# Patient Record
Sex: Female | Born: 1980 | Race: Black or African American | Hispanic: No | Marital: Married | Smoking: Never smoker
Health system: Southern US, Community
[De-identification: ages and names within clinical notes are randomized; demographics above are authoritative.]

## PROBLEM LIST (undated history)

## (undated) ENCOUNTER — Inpatient Hospital Stay (HOSPITAL_COMMUNITY): Payer: Self-pay

## (undated) DIAGNOSIS — G43909 Migraine, unspecified, not intractable, without status migrainosus: Secondary | ICD-10-CM

## (undated) DIAGNOSIS — R87619 Unspecified abnormal cytological findings in specimens from cervix uteri: Secondary | ICD-10-CM

## (undated) DIAGNOSIS — IMO0002 Reserved for concepts with insufficient information to code with codable children: Secondary | ICD-10-CM

## (undated) DIAGNOSIS — U071 COVID-19: Secondary | ICD-10-CM

## (undated) DIAGNOSIS — J45909 Unspecified asthma, uncomplicated: Secondary | ICD-10-CM

## (undated) DIAGNOSIS — R569 Unspecified convulsions: Secondary | ICD-10-CM

## (undated) DIAGNOSIS — K219 Gastro-esophageal reflux disease without esophagitis: Secondary | ICD-10-CM

## (undated) HISTORY — DX: Unspecified abnormal cytological findings in specimens from cervix uteri: R87.619

## (undated) HISTORY — DX: Migraine, unspecified, not intractable, without status migrainosus: G43.909

## (undated) HISTORY — DX: Gastro-esophageal reflux disease without esophagitis: K21.9

## (undated) HISTORY — DX: Unspecified asthma, uncomplicated: J45.909

## (undated) HISTORY — DX: COVID-19: U07.1

## (undated) HISTORY — DX: Reserved for concepts with insufficient information to code with codable children: IMO0002

## (undated) HISTORY — DX: Unspecified convulsions: R56.9

---

## 2005-08-24 HISTORY — PX: WISDOM TOOTH EXTRACTION: SHX21

## 2010-08-24 DIAGNOSIS — G43909 Migraine, unspecified, not intractable, without status migrainosus: Secondary | ICD-10-CM | POA: Insufficient documentation

## 2010-08-24 HISTORY — DX: Migraine, unspecified, not intractable, without status migrainosus: G43.909

## 2011-01-26 ENCOUNTER — Other Ambulatory Visit: Payer: Self-pay | Admitting: Family Medicine

## 2011-01-29 ENCOUNTER — Ambulatory Visit
Admission: RE | Admit: 2011-01-29 | Discharge: 2011-01-29 | Disposition: A | Discharge status: Home / Self Care | Payer: Managed Care, Other (non HMO) | Source: Ambulatory Visit | Attending: Family Medicine | Admitting: Family Medicine

## 2012-04-21 ENCOUNTER — Other Ambulatory Visit: Payer: Private Health Insurance - Indemnity

## 2012-04-21 ENCOUNTER — Telehealth: Payer: Self-pay | Admitting: Obstetrics and Gynecology

## 2012-04-21 DIAGNOSIS — N912 Amenorrhea, unspecified: Secondary | ICD-10-CM

## 2012-04-21 NOTE — Progress Notes (Unsigned)
Pt here for UPT.  Pt requested quant.  Ordered quant per Ar.

## 2012-04-22 ENCOUNTER — Telehealth: Payer: Self-pay

## 2012-04-22 ENCOUNTER — Telehealth: Payer: Self-pay | Admitting: Obstetrics and Gynecology

## 2012-04-22 LAB — HCG, QUANTITATIVE, PREGNANCY: hCG, Beta Chain, Quant, S: 52.8 m[IU]/mL

## 2012-04-22 NOTE — Telephone Encounter (Signed)
Spoke to pt to let her know her preg. Test was positive. , [facility]

## 2012-04-22 NOTE — Telephone Encounter (Signed)
Triage/res. °

## 2012-04-27 ENCOUNTER — Telehealth: Payer: Self-pay

## 2012-04-27 NOTE — Telephone Encounter (Signed)
LM for pt to cb and schedule a NOB int. And work-up with one of the midwives for + preg test  On the 29th Aug. Per AR. JO She was told to sched this around the first couple weeks in Oct.

## 2012-04-29 ENCOUNTER — Other Ambulatory Visit (HOSPITAL_BASED_OUTPATIENT_CLINIC_OR_DEPARTMENT_OTHER): Payer: Self-pay | Admitting: Family Medicine

## 2012-04-29 ENCOUNTER — Ambulatory Visit (HOSPITAL_BASED_OUTPATIENT_CLINIC_OR_DEPARTMENT_OTHER)
Admission: RE | Admit: 2012-04-29 | Discharge: 2012-04-29 | Disposition: A | Discharge status: Home / Self Care | Payer: Managed Care, Other (non HMO) | Source: Ambulatory Visit | Attending: Family Medicine | Admitting: Family Medicine

## 2012-04-29 ENCOUNTER — Encounter (HOSPITAL_BASED_OUTPATIENT_CLINIC_OR_DEPARTMENT_OTHER): Payer: Self-pay

## 2012-04-29 DIAGNOSIS — R1011 Right upper quadrant pain: Secondary | ICD-10-CM | POA: Insufficient documentation

## 2012-04-29 DIAGNOSIS — N949 Unspecified condition associated with female genital organs and menstrual cycle: Secondary | ICD-10-CM | POA: Insufficient documentation

## 2012-05-12 ENCOUNTER — Encounter: Payer: Self-pay | Admitting: Obstetrics and Gynecology

## 2012-05-12 ENCOUNTER — Ambulatory Visit (INDEPENDENT_AMBULATORY_CARE_PROVIDER_SITE_OTHER): Payer: Managed Care, Other (non HMO) | Admitting: Obstetrics and Gynecology

## 2012-05-12 DIAGNOSIS — Z331 Pregnant state, incidental: Secondary | ICD-10-CM

## 2012-05-12 LAB — POCT URINALYSIS DIPSTICK
Bilirubin, UA: NEGATIVE
Blood, UA: 1
Nitrite, UA: NEGATIVE
Spec Grav, UA: 1.015
pH, UA: 7

## 2012-05-13 LAB — PRENATAL PANEL VII
Basophils Absolute: 0.1 10*3/uL (ref 0.0–0.1)
HCT: 36.1 % (ref 36.0–46.0)
HIV: NONREACTIVE
Hemoglobin: 12.3 g/dL (ref 12.0–15.0)
Lymphocytes Relative: 33 % (ref 12–46)
Lymphs Abs: 2.9 10*3/uL (ref 0.7–4.0)
MCV: 80.8 fL (ref 78.0–100.0)
Monocytes Absolute: 0.8 10*3/uL (ref 0.1–1.0)
Monocytes Relative: 9 % (ref 3–12)
Neutro Abs: 4.9 10*3/uL (ref 1.7–7.7)
RBC: 4.47 MIL/uL (ref 3.87–5.11)
RDW: 13.5 % (ref 11.5–15.5)
Rh Type: POSITIVE
Rubella: 48.4 IU/mL — ABNORMAL HIGH
WBC: 8.8 10*3/uL (ref 4.0–10.5)

## 2012-05-14 LAB — CULTURE, OB URINE: Colony Count: 2000

## 2012-05-16 LAB — HEMOGLOBINOPATHY EVALUATION: Hgb F Quant: 0 % (ref 0.0–2.0)

## 2012-05-20 ENCOUNTER — Telehealth: Payer: Self-pay | Admitting: Obstetrics and Gynecology

## 2012-05-20 ENCOUNTER — Emergency Department: Payer: Self-pay | Admitting: Emergency Medicine

## 2012-05-20 NOTE — Telephone Encounter (Signed)
TC from pt.  States on 05/17/12 had lower rt calf pain and swelling.  Pain continued on and off for a few days and is now better.Now having chest pain.  Had shortness of breath 1 or 2 nights ago.  Has hx heart palpitations which improved with reflux med. Per VL advised medical  eval at ER. Pt states [facility] is closest. Advised to be seen there rather than [facility]. or [facility]. Pt agreeable.

## 2012-05-23 ENCOUNTER — Telehealth: Payer: Self-pay | Admitting: Obstetrics and Gynecology

## 2012-05-23 DIAGNOSIS — O26849 Uterine size-date discrepancy, unspecified trimester: Secondary | ICD-10-CM

## 2012-05-23 NOTE — Telephone Encounter (Signed)
TC from pt. States has eval last week for chest pain. States since 05/17/12 has had brown spotting x 2 days.  REucurred 05/21/12 and this AM. No recent IC. Had some cramping after walking last week. No UTI sx. Will consult with provider and call back.

## 2012-05-23 NOTE — Telephone Encounter (Signed)
TC to pt. Per DD informed scheduled for U/S and F/U.  05/24/12. To call increased bleeding or pain.  Pt verbalizes comprehension.

## 2012-05-24 ENCOUNTER — Encounter: Payer: Self-pay | Admitting: Obstetrics and Gynecology

## 2012-05-24 ENCOUNTER — Ambulatory Visit (INDEPENDENT_AMBULATORY_CARE_PROVIDER_SITE_OTHER): Payer: Managed Care, Other (non HMO)

## 2012-05-24 ENCOUNTER — Ambulatory Visit (INDEPENDENT_AMBULATORY_CARE_PROVIDER_SITE_OTHER): Payer: Managed Care, Other (non HMO) | Admitting: Obstetrics and Gynecology

## 2012-05-24 VITALS — BP 110/82 | Wt 166.0 lb

## 2012-05-24 DIAGNOSIS — B373 Candidiasis of vulva and vagina: Secondary | ICD-10-CM | POA: Insufficient documentation

## 2012-05-24 DIAGNOSIS — Z331 Pregnant state, incidental: Secondary | ICD-10-CM

## 2012-05-24 DIAGNOSIS — O26849 Uterine size-date discrepancy, unspecified trimester: Secondary | ICD-10-CM

## 2012-05-24 DIAGNOSIS — B3731 Acute candidiasis of vulva and vagina: Secondary | ICD-10-CM

## 2012-05-24 LAB — US OB COMP LESS 14 WKS

## 2012-05-24 MED ORDER — TERCONAZOLE 0.4 % VA CREA: 1.0000 | TOPICAL_CREAM | Freq: Every day | VAGINAL | Status: DC

## 2012-05-24 NOTE — Progress Notes (Signed)
Patient ID: Sylvia Townsend, female   DOB: May 30, 1981, 31 y.o.   MRN: [id number] [place] see note vaible pg dates agree LMP for dating.  Kinga Michelotti  30 y.o. [redacted]w[redacted]d C/o of brown discharge abd soft nt EGBUS WNL SSE mod amount white discharge no blood Cervix LTC A VVC P terazol 7 RX discussed, early pg s/s bleeding pain to report reviewed. [name], CNM

## 2012-05-24 NOTE — Progress Notes (Signed)
Pt c/o daily spotting and brownish discharge. F/u u/s 1st trimester spotting.  + FHT  Anteverted uterus Amnion and yolk sac seen  Normal ovaries/adnexa

## 2012-06-06 ENCOUNTER — Ambulatory Visit (INDEPENDENT_AMBULATORY_CARE_PROVIDER_SITE_OTHER): Payer: Managed Care, Other (non HMO)

## 2012-06-06 ENCOUNTER — Encounter: Payer: Self-pay | Admitting: Obstetrics and Gynecology

## 2012-06-06 ENCOUNTER — Ambulatory Visit (INDEPENDENT_AMBULATORY_CARE_PROVIDER_SITE_OTHER): Payer: Managed Care, Other (non HMO) | Admitting: Obstetrics and Gynecology

## 2012-06-06 VITALS — BP 122/72 | Ht 63.0 in | Wt 167.0 lb

## 2012-06-06 DIAGNOSIS — J45990 Exercise induced bronchospasm: Secondary | ICD-10-CM

## 2012-06-06 DIAGNOSIS — O368390 Maternal care for abnormalities of the fetal heart rate or rhythm, unspecified trimester, not applicable or unspecified: Secondary | ICD-10-CM

## 2012-06-06 DIAGNOSIS — Z331 Pregnant state, incidental: Secondary | ICD-10-CM

## 2012-06-06 LAB — US OB LIMITED

## 2012-06-06 NOTE — Progress Notes (Signed)
[redacted]w[redacted]d    Sylvia Townsend is being seen today for her first obstetrical visit at [redacted]w[redacted]d gestation by LMP 03/24/13.  She reports slight nausea and "would like to have some Zofran available if possible".  Her obstetrical history is significant for: Patient Active Problem List  Diagnosis  . Yeast vaginitis  . Pregnant state, incidental    Relationship with FOB: involved. The patient is married to FOB  Feeding plan:   Breast feed.  Pregnancy history fully reviewed.   Review of Systems Pertinent ROS is described in HPI   Objective:   BP 122/72  Ht 5\' 3"  (1.6 m)  Wt 167 lb (75.751 kg)  BMI 29.58 kg/m2  LMP 03/24/2012 Wt Readings from Last 1 Encounters:  06/06/12 167 lb (75.751 kg)   BMI: Body mass index is 29.58 kg/(m^2).  General: alert, cooperative and no distress Respiratory: clear to auscultation bilaterally Cardiovascular: regular rate and rhythm, S1, S2 normal, no murmur Breasts:  No dominant masses, nipples erect, tender to examine. Gastrointestinal: soft, non-tender; no masses,  no organomegaly Extremities: extremities normal, no pain or edema Vaginal Bleeding: None  EXTERNAL GENITALIA: normal appearing vulva with no masses, tenderness or lesions VAGINA: no abnormal discharge or lesions CERVIX: no lesions or cervical motion tenderness; cervix closed, long, firm UTERUS: gravid and consistent with 10 weeks ADNEXA: no masses palpable and nontender OB EXAM PELVIMETRY: appears adequate   FHR:  170  bpm  Assessment:    Pregnancy at  [redacted]w[redacted]d  Plan:     Prenatal panel reviewed and discussed with the patient:yes Pap smear collected:yes. The patient has hx of normal Pp smears. GC/Chlamydia collected:yes Wet prep:  PH 5.0, negative. Discussion of Genetic testing options: Offered. The patient might consider Quad and AFP at time of Glucola at 28 weeks. Prenatal vitamins recommended - taking same. Problem list reviewed and updated.  Plan of care: Follow up in 4  weeks for ROB. Patient has been given a letter to help her get time off work for her Prenatal care visits. MK has taken HR papers form her at her first visit and was unable to find them at this visit. Possibly being scanned into the system.                         Earl Gala, CNM.

## 2012-06-06 NOTE — Progress Notes (Signed)
[redacted]w[redacted]d Pt has no concerns

## 2012-06-07 LAB — PAP IG, CT-NG, RFX HPV ASCU
Chlamydia Probe Amp: NEGATIVE
GC Probe Amp: NEGATIVE

## 2012-06-16 ENCOUNTER — Inpatient Hospital Stay (HOSPITAL_COMMUNITY): Payer: Managed Care, Other (non HMO)

## 2012-06-16 ENCOUNTER — Inpatient Hospital Stay (HOSPITAL_COMMUNITY)
Admission: AD | Admit: 2012-06-16 | Discharge: 2012-06-16 | Disposition: A | Discharge status: Home / Self Care | Payer: Managed Care, Other (non HMO) | Source: Ambulatory Visit | Attending: Obstetrics and Gynecology | Admitting: Obstetrics and Gynecology

## 2012-06-16 ENCOUNTER — Encounter (HOSPITAL_COMMUNITY): Payer: Self-pay | Admitting: *Deleted

## 2012-06-16 DIAGNOSIS — O200 Threatened abortion: Secondary | ICD-10-CM

## 2012-06-16 DIAGNOSIS — O26859 Spotting complicating pregnancy, unspecified trimester: Secondary | ICD-10-CM | POA: Insufficient documentation

## 2012-06-16 LAB — URINALYSIS, ROUTINE W REFLEX MICROSCOPIC
Bilirubin Urine: NEGATIVE
Glucose, UA: NEGATIVE mg/dL
Ketones, ur: 40 mg/dL — AB
pH: 7 (ref 5.0–8.0)

## 2012-06-16 LAB — URINE MICROSCOPIC-ADD ON

## 2012-06-16 LAB — WET PREP, GENITAL: Yeast Wet Prep HPF POC: NONE SEEN

## 2012-06-16 NOTE — MAU Provider Note (Signed)
History   Sylvia Townsend called this morning with Hx of Spotting in the last 2 days. This morning she had bright re blood spotting PVnot associated with cramps. Advised to come to MAU  for evaluation.  Had further  CSN: [id number]  Arrival date and time: 06/16/12 0825   None     Chief Complaint  Patient presents with  . Vaginal Bleeding   HPI  OB History    Grav Para Term Preterm Abortions TAB SAB Ect Mult Living   1               Past Medical History  Diagnosis Date  . Abnormal Pap smear 2004,2005    Repeat  pap after 6 mos. back as normal;Last pap 03/2011;was normal  . Asthma     Induced w/ weather changes;inhaler prn  . Migraines 2012    Was rx'd meds   . Acid reflux     Was rx'd meds  . Heart palpitations 2013    had f/u w/ PCP;thought was related to acid reflux;happens @ night or after eating  . Seizure     x 2 as a Toddler    Past Surgical History  Procedure Date  . Wisdom tooth extraction 2007    all 4 removed    Family History  Problem Relation Age of Onset  . Stroke Maternal Grandfather   . Cancer Paternal Uncle     Throat  . Cancer Paternal Grandmother     Unsure which type  . Hypertension Maternal Grandfather   . Migraines Father   . Asthma Sister     x 2  . Asthma Cousin     Maternal  . Asthma Other     Nephew  . Kidney failure Maternal Uncle   . Lupus Sister   . Diverticulitis Mother     History  Substance Use Topics  . Smoking status: Never Smoker   . Smokeless tobacco: Never Used  . Alcohol Use: 1.0 oz/week    2 drink(s) per week     socially     Allergies: No Known Allergies  Prescriptions prior to admission  Medication Sig Dispense Refill  . Prenatal Vit-Fe Sulfate-FA (PRENATAL VITAMIN PO) Take 1 tablet by mouth daily.      Marland Kitchen albuterol (PROVENTIL HFA;VENTOLIN HFA) 108 (90 BASE) MCG/ACT inhaler Inhale 2 puffs into the lungs every 6 (six) hours as needed. Rescue inhaler        Review of Systems  Constitutional: Negative.    HENT: Negative.   Eyes: Negative.   Respiratory: Negative.   Cardiovascular: Negative.   Gastrointestinal: Negative.   Genitourinary: Negative.   Musculoskeletal: Negative.   Skin: Negative.   Neurological: Negative.   Endo/Heme/Allergies: Negative.   Psychiatric/Behavioral: Negative.    Physical Exam   Blood pressure 118/76, pulse 86, temperature 98 F (36.7 C), temperature source Oral, resp. rate 18, height 5' 2.5" (1.588 m), weight 170 lb (77.111 kg), last menstrual period 03/24/2012.  Physical Exam  Constitutional: She is oriented to person, place, and time. She appears well-developed and well-nourished.  Eyes: Conjunctivae normal and EOM are normal. Pupils are equal, round, and reactive to light.  Neck: Normal range of motion. Neck supple.  Cardiovascular: Normal rate, regular rhythm and normal heart sounds.   Respiratory: Effort normal and breath sounds normal.  GI: Soft. Bowel sounds are normal.  Genitourinary: Vagina normal and uterus normal.       Spotting PV at 12 GA  Musculoskeletal: Normal range of  motion.  Neurological: She is alert and oriented to person, place, and time. She has normal reflexes.  Skin: Skin is warm and dry.  Psychiatric: She has a normal mood and affect.    MAU Course  Procedures Speculum examination: no blood in vault No hemorrhoids Wet Prep GC/ Chlamydia Limited OB USS to R/O [facility]  Assessment and Plan  Wet Prep: Neg GC/ Chlamydia : pending Speculum examination: neg USS: normal findings with no SCH noted  Naya Ilagan, CNM. 06/16/2012, 10:53 AM

## 2012-06-16 NOTE — MAU Note (Signed)
Bright red bleeding noted when first got up this morning.  On Tues noted pinkish streaks in d/c.  No pain last night or today.

## 2012-06-17 LAB — GC/CHLAMYDIA PROBE AMP, GENITAL
Chlamydia, DNA Probe: NEGATIVE
GC Probe Amp, Genital: NEGATIVE

## 2012-06-18 LAB — URINE CULTURE

## 2012-07-04 ENCOUNTER — Encounter: Payer: Self-pay | Admitting: Obstetrics and Gynecology

## 2012-07-04 ENCOUNTER — Ambulatory Visit (INDEPENDENT_AMBULATORY_CARE_PROVIDER_SITE_OTHER): Payer: Managed Care, Other (non HMO) | Admitting: Obstetrics and Gynecology

## 2012-07-04 VITALS — BP 110/70 | Wt 163.0 lb

## 2012-07-04 DIAGNOSIS — Z331 Pregnant state, incidental: Secondary | ICD-10-CM

## 2012-07-04 DIAGNOSIS — Z8719 Personal history of other diseases of the digestive system: Secondary | ICD-10-CM

## 2012-07-04 DIAGNOSIS — Z3689 Encounter for other specified antenatal screening: Secondary | ICD-10-CM

## 2012-07-04 NOTE — Addendum Note (Signed)
Addended by: Janeece Agee on: 07/04/2012 09:36 AM   Modules accepted: Orders

## 2012-07-04 NOTE — Patient Instructions (Signed)
Exercise During Pregnancy It is possible to maintain a healthy exercise program throughout a pregnancy. However, it is important to discuss exercising with your caregiver, so that the two of you may develop an appropriate exercise program. It is important to remember that exercise during pregnancy should be use to maintain one's health and not to lose weight. Strenuous activities should be avoided as the may cause the baby to have difficulty obtaining proper amounts of oxygen. A proper pregnancy exercise plan has many benefits including preparing you for the physical challenges of childbirth by strengthening the muscles that help with childbirth, reducing common backaches, alleviating constipation, improving posture, elevating mood, and promoting better sleep. If possible, begin exercising regularly before you become pregnant and continue through the duration of the pregnancy. It is difficult for a woman to begin an exercise program later in a pregnancy due to enlargement of the uterus and breasts and a shift in the center of gravity. Pregnancy exercise programs should be aimed at improving the muscles of the heart, back, pelvis, and abdomen. GENERAL GUIDELINES  Every woman and pregnancy is different; thus, the level of exercise you can do depends on your health, the conditions of the pregnancy, and activity level before pregnancy. For women who were sedentary before pregnancy, walking is a good way to begin. Use caution while participation in sports during pregnancy, because your center of gravity changes and may affect your balance or that require rapid movements. Always make sure to drink plenty of fluids to avoid dehydration, which may decrease blood flow to your baby. Avoid any activity that has the potential for trauma to the abdomen. If possible try to avoid high-altitude activities, which can deprive you and your baby of oxygen; this may cause premature labor. Talk with your caregiver.  Performing a  proper warm up and cool down are very important. It is important to start slowly and build up to more demanding exercises. Toward the end of an exercise session, gradually slow your activity. Perhaps try and work back through the exercises in reverse order. Check your pulse during peak activity, and discuss with your caregiver an appropriate range of heart rate for activity. Slow down your activity if your heart starts beating faster than the target range recommended by your health care provider. Do not exceed a heart rate of 140 beats per minute. Exercise that is too strenuous may speed up the baby's heartbeat to a dangerous level. In general, if you are able to carry on a conversation comfortably while exercising, your heart rate is probably within the recommended limits. Check to make sure.  You should stop exercising and call your health care provider if you have any unusual symptoms, such as pain, uterine contractions, chest pain, bleeding or fluid leakage from the vagina, dizziness, or shortness of breath. Talk to your health caregiver if you have any questions.  Document Released: 08/10/2005 Document Revised: 11/02/2011 Document Reviewed: 11/22/2008 [facility] Patient Information 2013 [place], [place].  Round Ligament Pain The round ligament is made up of muscle and fibrous tissue. It is attached to the uterus near the fallopian tube. The round ligament is located on both sides of the uterus and helps support the position of the uterus. It usually begins in the second trimester of pregnancy when the uterus comes out of the pelvis. The pain can come and go until the baby is delivered. Round ligament pain is not a serious problem and does not cause harm to the baby. CAUSE During pregnancy the uterus  grows the most from the second trimester to delivery. As it grows, it stretches and slightly twists the round ligaments. When the uterus leans from one side to the other, the round ligament on the opposite side  pulls and stretches. This can cause pain. SYMPTOMS  Pain can occur on one side or both sides. The pain is usually a short, sharp, and pinching-like. Sometimes it can be a dull, lingering and aching pain. The pain is located in the lower side of the abdomen or in the groin. The pain is internal and usually starts deep in the groin and moves up to the outside of the hip area. Pain can occur with:  Sudden change in position like getting out of bed or a chair.  Rolling over in bed.  Coughing or sneezing.  Walking too much.  Any type of physical activity. DIAGNOSIS  Your caregiver will make sure there are no serious problems causing the pain. When nothing serious is found, the symptoms usually indicate that the pain is from the round ligament. TREATMENT   Sit down and relax when the pain starts.  Flex your knees up to your belly.  Lay on your side with a pillow under your belly (abdomen) and another one between your legs.  Sit in a hot bath for 15 to 20 minutes or until the pain goes away. HOME CARE INSTRUCTIONS   Only take over-the-counter or prescriptions medicines for pain, discomfort or fever as directed by your caregiver.  Sit and stand slowly.  Avoid long walks if it causes pain.  Stop or lessen your physical activities if it causes pain. SEEK MEDICAL CARE IF:   The pain does not go away with any of your treatment.  You need stronger medication for the pain.  You develop back pain that you did not have before with the side pain. SEEK IMMEDIATE MEDICAL CARE IF:   You develop a temperature of 102 F (38.9 C) or higher.  You develop uterine contractions.  You develop vaginal bleeding.  You develop nausea, vomiting or diarrhea.  You develop chills.  You have pain when you urinate. Document Released: 05/19/2008 Document Revised: 11/02/2011 Document Reviewed: 05/19/2008 [facility] Patient Information 2013 [place], [place].

## 2012-07-04 NOTE — Progress Notes (Signed)
[redacted]w[redacted]d Pt agrees to FLU shot  No VB since seen at [facility] 10/24 Some RLP Walks for exercise 6+ miles!  rv'd keeping HR <140, if uncomfortable stop rv'd stretching and comfort measures Desires quad screen Hx migraines - none until this weekend, resolved w sleep, rvd comfort measures ok to take motrin until 28wks rv'd pt hx of "heart palpitations" sounds more like reflux, has been ok, rec zantac PRN rv'd hx asthma - uses albuterol PRN, most during colder weather, hasn't needed it recently  RTO 4wks for quad and anatomy scan

## 2012-07-04 NOTE — Progress Notes (Signed)
[redacted]w[redacted]d Recent hospital visit due to spotting; spotting has subsided  Pt wants genetic screenings Pt wants to wait until she is further along before getting flu vaccine.

## 2012-08-01 ENCOUNTER — Encounter: Payer: Self-pay | Admitting: Obstetrics and Gynecology

## 2012-08-01 ENCOUNTER — Ambulatory Visit (INDEPENDENT_AMBULATORY_CARE_PROVIDER_SITE_OTHER): Payer: Managed Care, Other (non HMO)

## 2012-08-01 ENCOUNTER — Ambulatory Visit (INDEPENDENT_AMBULATORY_CARE_PROVIDER_SITE_OTHER): Payer: Managed Care, Other (non HMO) | Admitting: Obstetrics and Gynecology

## 2012-08-01 VITALS — BP 120/80 | Wt 175.0 lb

## 2012-08-01 DIAGNOSIS — Z1379 Encounter for other screening for genetic and chromosomal anomalies: Secondary | ICD-10-CM

## 2012-08-01 DIAGNOSIS — O26899 Other specified pregnancy related conditions, unspecified trimester: Secondary | ICD-10-CM

## 2012-08-01 DIAGNOSIS — R300 Dysuria: Secondary | ICD-10-CM

## 2012-08-01 DIAGNOSIS — Z3689 Encounter for other specified antenatal screening: Secondary | ICD-10-CM

## 2012-08-01 LAB — US OB COMP + 14 WK

## 2012-08-01 LAB — POCT URINALYSIS DIPSTICK
Blood, UA: NEGATIVE
Protein, UA: NEGATIVE
Spec Grav, UA: 1.01
Urobilinogen, UA: NEGATIVE

## 2012-08-01 NOTE — Progress Notes (Signed)
Patient ID: Sylvia Townsend, female   DOB: 1980/10/14, 31 y.o.   MRN: [id number] w4d [redacted]w[redacted]d Plans quad today US wnl [name], CNM

## 2012-08-01 NOTE — Progress Notes (Signed)
[redacted]w[redacted]d C/o having one episode of heartburn plan otc zantac 150 Discussed common discomforts of pg, excessive weight gain, nutrition. Avoid sauces, gravy, sweet tea, soda, fried foods, limit eating out and watch food choices and portion sizes. No more than 1/2 cup juice daily, better to eat fruit then drink juice. Increase fiber in diet, fresh rather than processed foods. Get protein throught meals and snacks to include meat, eggs, beans, nuts skim an fat free dairy: milk, cheese, yougart, 8 glasses of water daily. Exercise discussed. [name], CNM  Ultrasound shows:  SIUP  S=D     [place] EDD: 12/29/12           AFI: NORMAL (VERTICAL POCKET = 4.8 CM)           Cervical length: 3.76 cm           Placenta localization: posterior           Fetal presentation: Vertex                   Anatomy survey is normal           Gender : female

## 2012-08-02 LAB — AFP, QUAD SCREEN
AFP: 64.4 IU/mL
Age Alone: 1:791 {titer}
Curr Gest Age: 18.4 wks.days
Down Syndrome Scr Risk Est: <1:38500 {titer}
MoM for INH: 0.97
Trisomy 18 (Edward) Syndrome Interp.: 1:9080 {titer}

## 2012-08-24 NOTE — L&D Delivery Note (Signed)
Delivery Note  Pt began pushing about 0430 w good effort, FHR remained reassuring w occ mild variables  At 5:21 AM a viable female was delivered via Vaginal, Spontaneous Delivery (Presentation: ; Occiput Anterior).  Delivered through loose nuchal cord, shoulders delivered easily, APGAR: 9, 9; weight: pending  Placenta status: Intact, Spontaneous.  Cord: 3 vessels with the following complications: None.  Cord pH: n/a  Anesthesia: Epidural, local  Episiotomy: None Lacerations: 3rd degree;Vaginal Suture Repair: 3.0 vicryl Est. Blood Loss (mL): 300  Mom to postpartum.  Baby to nursery-stable. Infant remains skin-skin Pt plans to BF Pt desires inpatient circumcision  Mom and baby stable in recovery room Routine PP orders   Haruka Kowaleski M 12/25/2012, 6:29 AM

## 2012-08-29 ENCOUNTER — Ambulatory Visit (INDEPENDENT_AMBULATORY_CARE_PROVIDER_SITE_OTHER): Payer: Managed Care, Other (non HMO) | Admitting: Obstetrics and Gynecology

## 2012-08-29 VITALS — BP 130/62 | Wt 180.0 lb

## 2012-08-29 DIAGNOSIS — Z331 Pregnant state, incidental: Secondary | ICD-10-CM

## 2012-08-29 NOTE — Progress Notes (Signed)
[redacted]w[redacted]d No complaints Glucola at [place] RTO 4wks

## 2012-09-14 ENCOUNTER — Telehealth: Payer: Self-pay | Admitting: Obstetrics and Gynecology

## 2012-09-14 ENCOUNTER — Telehealth: Payer: Self-pay

## 2012-09-14 NOTE — Telephone Encounter (Signed)
I checked back with this pt to see how she's doing. She states she is feeling a little better. She did vomit up a little bit of water, but has kept down some crackers. She is resting. I told her to continue to observe and to follow previous instructions and keep me posted as to her sx's throughout  this evening and into tomorrow. She should call me directly if her sx's worsen. Pt is agreeable. Sylvia Townsend A

## 2012-09-14 NOTE — Telephone Encounter (Signed)
Spoke to pt who is 24 weeks preg and has been vomiting q 2 hrs since midnight last night. She has low grade temp of 99.1. She has some low back pain and body aches. She denies any dizziness or light-headedness. She reports good FM. She is calling for suggestions. She states she has been able to keep some Ginger Ale down with-in the last 30 mins. I recommended a few things to try....mainly BRAT diet, diluted juice as tolerated, Tylenol, rest and I asked her to check to see if she has any anti-nausea meds from earlier on in preg. She will look and if she does she will try that to see if she's able to keep that down. I will check back with her in a couple of hrs to re-eval her sx's. And we will go from there. Pt is agreeable and will try these suggestions. Melody Comas A

## 2012-09-16 ENCOUNTER — Ambulatory Visit: Payer: Managed Care, Other (non HMO) | Admitting: Obstetrics and Gynecology

## 2012-09-16 ENCOUNTER — Telehealth: Payer: Self-pay

## 2012-09-16 VITALS — BP 100/58 | Wt 187.0 lb

## 2012-09-16 DIAGNOSIS — Z331 Pregnant state, incidental: Secondary | ICD-10-CM

## 2012-09-16 DIAGNOSIS — N898 Other specified noninflammatory disorders of vagina: Secondary | ICD-10-CM

## 2012-09-16 LAB — POCT WET PREP (WET MOUNT)

## 2012-09-16 NOTE — Progress Notes (Signed)
C/o brownish discharge. Pt stated no other issues .

## 2012-09-16 NOTE — Progress Notes (Signed)
Patient ID: Sylvia Townsend, female   DOB: 10/13/1980, 32 y.o.   MRN: [id number] [redacted]w[redacted]d Vomiting with stomach flu 3 days ago and had red to brown discharge resolved, no irritation,   [name]   SSE mod amount thick yellow discharge, EGBUS WNL, cervix LTC Wet prep neg clue, neg trich, neg hypae Reviewed s/sto report preterm labor if 6 contractions in 1 hour after po fluids,  rest and frequent voids, srom, vag bleeding, daily kick counts to report,  encouraged 8 water daily and frequent voids. [name], CNM

## 2012-09-16 NOTE — Telephone Encounter (Signed)
Pt needs appt w/ MK per VL  Pt scheduled appt for 2:30pm  Pt agreeable.  [facility] CMA

## 2012-09-16 NOTE — Telephone Encounter (Signed)
Pt just getting over a GI virus  Feeling better returned to work today Pt called c/o brown spotting this morning around 10am  W/ wiping  Pt has not had intercourse since last weekend + cramping pain nothing severe, pt believes it to be ligament pain + FM Pt stated brown spotting heavy this morning but not enough to soak a panty liner. Will consult w/ Midwife or on call provider and call pt back  Pt agreeable   [facility] CMA

## 2012-09-26 ENCOUNTER — Ambulatory Visit: Payer: Managed Care, Other (non HMO) | Admitting: Obstetrics and Gynecology

## 2012-09-26 VITALS — BP 104/72 | Wt 187.0 lb

## 2012-09-26 DIAGNOSIS — Z331 Pregnant state, incidental: Secondary | ICD-10-CM

## 2012-09-26 LAB — CBC
HCT: 35.7 % — ABNORMAL LOW (ref 36.0–46.0)
MCV: 80.4 fL (ref 78.0–100.0)
Platelets: 247 10*3/uL (ref 150–400)
RBC: 4.44 MIL/uL (ref 3.87–5.11)
WBC: 14 10*3/uL — ABNORMAL HIGH (ref 4.0–10.5)

## 2012-09-26 NOTE — Progress Notes (Signed)
Doing well. Weight gain stable after increase last visit. Glucola, Hgb, RPR today. Blood type B+.

## 2012-09-26 NOTE — Progress Notes (Signed)
Pt stated no issues today. GGT given @  2:37     Draw@   3:37

## 2012-09-27 ENCOUNTER — Telehealth: Payer: Self-pay | Admitting: Obstetrics and Gynecology

## 2012-09-27 LAB — GLUCOSE TOLERANCE, 1 HOUR (50G) W/O FASTING: Glucose, 1 Hour GTT: 103 mg/dL (ref 70–140)

## 2012-09-27 NOTE — Telephone Encounter (Signed)
Tc to pt regarding msg.  Lm on vm to call back. 

## 2012-09-27 NOTE — Telephone Encounter (Signed)
Pt returned call, informed 1 hour glucola results were normal.  Pt very relieved and happy to her the news.

## 2012-09-27 NOTE — Telephone Encounter (Signed)
Message copied by Delon Sacramento on Tue Sep 27, 2012 12:58 PM ------      Message from: Cornelius Moras      Created: Tue Sep 27, 2012  7:28 AM       Please call patient and advise of normal 1 hour--she was anxious about the result.      Thanks!      VL

## 2012-10-19 ENCOUNTER — Ambulatory Visit: Payer: Managed Care, Other (non HMO) | Admitting: Certified Nurse Midwife

## 2012-10-19 VITALS — BP 110/62 | Wt 198.0 lb

## 2012-10-19 NOTE — Patient Instructions (Signed)
Preventing Preterm Labor Preterm labor is when a pregnant woman has contractions that cause the cervix to open, shorten, and thin before 37 weeks of pregnancy. You will have regular contractions (tightening) 2 to 3 minutes apart. This usually causes discomfort or pain. HOME CARE  Eat a healthy diet.  Take your vitamins as told by your doctor.  Drink enough fluids to keep your pee (urine) clear or pale yellow every day.  Get rest and sleep.  Do not have sex if you are at high risk for preterm labor.  Follow your doctor's advice about activity, medicines, and tests.  Avoid stress.  Avoid hard labor or exercise that lasts for a long time.  Do not smoke. GET HELP RIGHT AWAY IF:   You are having contractions.  You have belly (abdominal) pain.  You have bleeding from your vagina.  You have pain when you pee (urinate).  You have abnormal discharge from your vagina.  You have a temperature by mouth above 102 F (38.9 C). MAKE SURE YOU:  Understand these instructions.  Will watch your condition.  Will get help if you are not doing well or get worse. Document Released: 11/06/2008 Document Revised: 11/02/2011 Document Reviewed: 11/06/2008 [facility] Patient Information 2013 [place], [place].  Natural Childbirth Natural childbirth is going through labor and delivery without any drugs to relieve pain. You also do not use fetal monitors, have a cesarean delivery, or get a sugical cut to enlarge the vaginal opening (episiotomy). With the help of a birthing professional (midwife), you will direct your own labor and delivery as you choose. Many women chose natural childbirth because they feel more in control and in touch with their labor and delivery. They are also concerned about the medications affecting themselves and the baby. Pregnant women with a high risk pregnancy should not attempt natural childbirth. It is better to deliver the infant in a hospital if an emergency situation  arises. Sometimes, the caregiver has to intervene for the health and safety of the mother and infant. TWO TECHNIQUES FOR NATURAL CHILDBIRTH:   The Lamaze method. This method teaches women that having a baby is normal, healthy, and natural. It also teaches the mother to take a neutral position regarding pain medication and anesthesia and to make an informed decision if and when it is right for them.  The Erven Colla (also called husband coached birth). This method teaches the father to be the birth coach and stresses a natural approach. It also encourages exercise and a balanced diet with good nutrition. The exercises teach relaxation and deep breathing techniques. However, there are also classes to prepare the parents for an emergency situation that may occur. METHODS OF DEALING WITH LABOR PAIN AND DELIVERY:  Meditation.  Yoga.  Hypnosis.  Acupuncture.  Massage.  Changing positions (walking, rocking, showering, leaning on birth balls).  Lying in warm water or a jacuzzi.  Find an activity that keeps your mind off of the labor pain.  Listen to soft music.  Visual imagery (focus on a particular object). BEFORE GOING INTO LABOR  Be sure you and your spouse/partner are in agreement to have natural childbirth.  Decide if your caregiver or a midwife will deliver your baby.  Decide if you will have your baby in the hospital, birthing center, or at home.  If you have children, make plans to have someone to take care of them when you go to the hospital.  Know the distance and the time it takes to go to the  delivery center. Make a dry run to be sure.  Have a bag packed with a night gown, bathrobe, and toiletries ready to take when you go into labor.  Keep phone numbers of your family and friends handy if you need to call someone when you go into labor.  Your spouse or partner should go to all the teaching classes.  Talk with your caregiver about the possibility of a medical  emergency and what will happen if that occurs. ADVANTAGES OF NATURAL CHILDBIRTH  You are in control of your labor and delivery.  It is safe.  There are no medications or anesthetics that may affect you and the fetus.  There are no invasive procedures such as an episiotomy.  You and your partner will work together, which can increase your bond.  Meditation, yoga, massage, and breathing exercises can be learned while pregnant and help you when you are in labor and at delivery.  In most delivery centers, the family and friends can be involved in the labor and delivery process. DISADVANTAGES OF NATURAL CHILDBIRTH  You will experience pain during your labor and delivery.  The methods of helping relieve your labor pains may not work for you.  You may feel embarrassed, disappointed, and like a failure if you decide to change your mind during labor and not have natural childbirth. AFTER THE DELIVERY  You will be very tired.  You will be uncomfortable because of your uterus contracting. You will feel soreness around the vagina.  You may feel cold and shaky.This is a natural reaction.  You will be excited, overwhelmed, accomplished, and proud to be a mother. HOME CARE INSTRUCTIONS   Follow the advice and instructions of your caregiver.  Follow the instructions of your natural childbirth instructor (Lamaze or Bradley Method). Document Released: 07/23/2008 Document Revised: 11/02/2011 Document Reviewed: 07/23/2008 [facility] Patient Information 2013 [place], [place].  Normal Labor and Delivery Your caregiver must first be sure you are in labor. Signs of labor include:  You may pass what is called "the mucus plug" before labor begins. This is a small amount of blood stained mucus.  Regular uterine contractions.  The time between contractions get closer together.  The discomfort and pain gradually gets more intense.  Pains are mostly located in the back.  Pains get worse when  walking.  The cervix (the opening of the uterus becomes thinner (begins to efface) and opens up (dilates). Once you are in labor and admitted into the hospital or care center, your caregiver will do the following:  A complete physical examination.  Check your vital signs (blood pressure, pulse, temperature and the fetal heart rate).  Do a vaginal examination (using a sterile glove and lubricant) to determine:  The position (presentation) of the baby (head [vertex] or buttock first).  The level (station) of the baby's head in the birth canal.  The effacement and dilatation of the cervix.  You may have your pubic hair shaved and be given an enema depending on your caregiver and the circumstance.  An electronic monitor is usually placed on your abdomen. The monitor follows the length and intensity of the contractions, as well as the baby's heart rate.  Usually, your caregiver will insert an IV in your arm with a bottle of sugar water. This is done as a precaution so that medications can be given to you quickly during labor or delivery. NORMAL LABOR AND DELIVERY IS DIVIDED UP INTO 3 STAGES: First Stage This is when regular contractions begin and  the cervix begins to efface and dilate. This stage can last from 3 to 15 hours. The end of the first stage is when the cervix is 100% effaced and 10 centimeters dilated. Pain medications may be given by   Injection (morphine, demerol, etc.)  Regional anesthesia (spinal, caudal or epidural, anesthetics given in different locations of the spine). Paracervical pain medication may be given, which is an injection of and anesthetic on each side of the cervix. A pregnant woman may request to have "Natural Childbirth" which is not to have any medications or anesthesia during her labor and delivery. Second Stage This is when the baby comes down through the birth canal (vagina) and is born. This can take 1 to 4 hours. As the baby's head comes down through  the birth canal, you may feel like you are going to have a bowel movement. You will get the urge to bear down and push until the baby is delivered. As the baby's head is being delivered, the caregiver will decide if an episiotomy (a cut in the perineum and vagina area) is needed to prevent tearing of the tissue in this area. The episiotomy is sewn up after the delivery of the baby and placenta. Sometimes a mask with nitrous oxide is given for the mother to breath during the delivery of the baby to help if there is too much pain. The end of Stage 2 is when the baby is fully delivered. Then when the umbilical cord stops pulsating it is clamped and cut. Third Stage The third stage begins after the baby is completely delivered and ends after the placenta (afterbirth) is delivered. This usually takes 5 to 30 minutes. After the placenta is delivered, a medication is given either by intravenous or injection to help contract the uterus and prevent bleeding. The third stage is not painful and pain medication is usually not necessary. If an episiotomy was done, it is repaired at this time. After the delivery, the mother is watched and monitored closely for 1 to 2 hours to make sure there is no postpartum bleeding (hemorrhage). If there is a lot of bleeding, medication is given to contract the uterus and stop the bleeding. Document Released: 05/19/2008 Document Revised: 11/02/2011 Document Reviewed: 05/19/2008 [facility] Patient Information 2013 [place], [place].

## 2012-10-19 NOTE — Progress Notes (Signed)
[redacted]w[redacted]d 1 hr gtt = 103 Pt states she is having low abd pain and upper abd pressure when lying down.

## 2012-10-19 NOTE — Progress Notes (Signed)
[redacted]w[redacted]d Pt feeling well, c/o RL discomfort and lower back pain. Reassurance given Discussed when to call with labor sx. Pt instructions given: PTL prevention and Labor s/s

## 2012-11-02 ENCOUNTER — Ambulatory Visit: Payer: Managed Care, Other (non HMO) | Admitting: Obstetrics and Gynecology

## 2012-11-02 ENCOUNTER — Encounter: Payer: Self-pay | Admitting: Obstetrics and Gynecology

## 2012-11-02 VITALS — BP 102/62 | Wt 201.0 lb

## 2012-11-02 NOTE — Progress Notes (Signed)
[redacted]w[redacted]d Pt has noticed in the last 2 weeks being short of breath. She thinks it could be from the weight gain.

## 2012-11-02 NOTE — Progress Notes (Signed)
[redacted]w[redacted]d  GFM Glucola normal Lungs clear: reassurance Weight gain and diet reviewed

## 2012-12-20 ENCOUNTER — Encounter (HOSPITAL_COMMUNITY): Payer: Self-pay | Admitting: *Deleted

## 2012-12-20 ENCOUNTER — Inpatient Hospital Stay (HOSPITAL_COMMUNITY)
Admission: AD | Admit: 2012-12-20 | Discharge: 2012-12-20 | Disposition: A | Discharge status: Home / Self Care | Payer: Managed Care, Other (non HMO) | Source: Ambulatory Visit | Attending: Obstetrics and Gynecology | Admitting: Obstetrics and Gynecology

## 2012-12-20 DIAGNOSIS — R030 Elevated blood-pressure reading, without diagnosis of hypertension: Secondary | ICD-10-CM | POA: Insufficient documentation

## 2012-12-20 DIAGNOSIS — O479 False labor, unspecified: Secondary | ICD-10-CM | POA: Insufficient documentation

## 2012-12-20 DIAGNOSIS — O471 False labor at or after 37 completed weeks of gestation: Secondary | ICD-10-CM

## 2012-12-20 DIAGNOSIS — O99891 Other specified diseases and conditions complicating pregnancy: Secondary | ICD-10-CM | POA: Insufficient documentation

## 2012-12-20 DIAGNOSIS — R109 Unspecified abdominal pain: Secondary | ICD-10-CM | POA: Insufficient documentation

## 2012-12-20 DIAGNOSIS — Z8719 Personal history of other diseases of the digestive system: Secondary | ICD-10-CM

## 2012-12-20 DIAGNOSIS — O368190 Decreased fetal movements, unspecified trimester, not applicable or unspecified: Secondary | ICD-10-CM | POA: Insufficient documentation

## 2012-12-20 NOTE — MAU Note (Signed)
Patient states she had a lot of irregular contractions yesterday bur has had only 4 today with a lot of cramping in between. Denies bleeding or leaking. Patient reports the baby is moving but not as actively as before.

## 2012-12-20 NOTE — Progress Notes (Signed)
Written and verbal d/c instructions given and understanding voiced. 

## 2012-12-20 NOTE — Progress Notes (Signed)
Haroldine Laws CNM notified of pt's status. CNM in BS with labor pt. RN to reck cervix.

## 2012-12-20 NOTE — Progress Notes (Signed)
Haroldine Laws CNM notified of pt's admission and status. Aware of elevated B/Ps. Will see pt

## 2012-12-20 NOTE — Progress Notes (Signed)
PT up to walk

## 2012-12-24 ENCOUNTER — Encounter (HOSPITAL_COMMUNITY): Payer: Self-pay | Admitting: *Deleted

## 2012-12-24 ENCOUNTER — Inpatient Hospital Stay (HOSPITAL_COMMUNITY): Payer: Managed Care, Other (non HMO) | Admitting: Anesthesiology

## 2012-12-24 ENCOUNTER — Inpatient Hospital Stay (HOSPITAL_COMMUNITY)
Admission: AD | Admit: 2012-12-24 | Discharge: 2012-12-26 | DRG: 775 | Disposition: A | Discharge status: Home / Self Care | Payer: Managed Care, Other (non HMO) | Source: Ambulatory Visit | Attending: Obstetrics and Gynecology | Admitting: Obstetrics and Gynecology

## 2012-12-24 ENCOUNTER — Encounter (HOSPITAL_COMMUNITY): Payer: Self-pay | Admitting: Anesthesiology

## 2012-12-24 DIAGNOSIS — J45990 Exercise induced bronchospasm: Secondary | ICD-10-CM | POA: Diagnosis present

## 2012-12-24 DIAGNOSIS — IMO0001 Reserved for inherently not codable concepts without codable children: Secondary | ICD-10-CM

## 2012-12-24 DIAGNOSIS — Z8719 Personal history of other diseases of the digestive system: Secondary | ICD-10-CM

## 2012-12-24 LAB — CBC
HCT: 39.8 % (ref 36.0–46.0)
Hemoglobin: 13.6 g/dL (ref 12.0–15.0)
MCV: 81.9 fL (ref 78.0–100.0)
WBC: 10.9 10*3/uL — ABNORMAL HIGH (ref 4.0–10.5)

## 2012-12-24 LAB — TYPE AND SCREEN: Antibody Screen: NEGATIVE

## 2012-12-24 MED ORDER — SODIUM BICARBONATE 8.4 % IV SOLN
INTRAVENOUS | Status: DC | PRN
  Administered 2012-12-24: 5 mL via EPIDURAL

## 2012-12-24 MED ORDER — OXYTOCIN 40 UNITS IN LACTATED RINGERS INFUSION - SIMPLE MED
62.5000 mL/h | INTRAVENOUS | Status: DC
  Administered 2012-12-25: 62.5 mL/h via INTRAVENOUS

## 2012-12-24 MED ORDER — LACTATED RINGERS IV SOLN
500.0000 mL | Freq: Once | INTRAVENOUS | Status: AC
  Administered 2012-12-24: 500 mL via INTRAVENOUS

## 2012-12-24 MED ORDER — PHENYLEPHRINE 40 MCG/ML (10ML) SYRINGE FOR IV PUSH (FOR BLOOD PRESSURE SUPPORT): 80.0000 ug | PREFILLED_SYRINGE | INTRAVENOUS | Status: DC | PRN

## 2012-12-24 MED ORDER — PHENYLEPHRINE 40 MCG/ML (10ML) SYRINGE FOR IV PUSH (FOR BLOOD PRESSURE SUPPORT)
80.0000 ug | PREFILLED_SYRINGE | INTRAVENOUS | Status: DC | PRN
  Filled 2012-12-24: qty 5

## 2012-12-24 MED ORDER — LACTATED RINGERS IV SOLN
INTRAVENOUS | Status: DC
  Administered 2012-12-24: 20:00:00 via INTRAVENOUS

## 2012-12-24 MED ORDER — OXYCODONE-ACETAMINOPHEN 5-325 MG PO TABS: 1.0000 | ORAL_TABLET | ORAL | Status: DC | PRN

## 2012-12-24 MED ORDER — LACTATED RINGERS IV SOLN: 500.0000 mL | INTRAVENOUS | Status: DC | PRN

## 2012-12-24 MED ORDER — OXYTOCIN BOLUS FROM INFUSION: 500.0000 mL | INTRAVENOUS | Status: DC

## 2012-12-24 MED ORDER — ONDANSETRON HCL 4 MG/2ML IJ SOLN: 4.0000 mg | Freq: Four times a day (QID) | INTRAMUSCULAR | Status: DC | PRN

## 2012-12-24 MED ORDER — EPHEDRINE 5 MG/ML INJ: 10.0000 mg | INTRAVENOUS | Status: DC | PRN

## 2012-12-24 MED ORDER — ACETAMINOPHEN 325 MG PO TABS: 650.0000 mg | ORAL_TABLET | ORAL | Status: DC | PRN

## 2012-12-24 MED ORDER — LIDOCAINE HCL (PF) 1 % IJ SOLN
30.0000 mL | INTRAMUSCULAR | Status: DC | PRN
  Administered 2012-12-25: 30 mL via SUBCUTANEOUS
  Filled 2012-12-24 (×2): qty 30

## 2012-12-24 MED ORDER — EPHEDRINE 5 MG/ML INJ
10.0000 mg | INTRAVENOUS | Status: DC | PRN
  Filled 2012-12-24: qty 4

## 2012-12-24 MED ORDER — IBUPROFEN 600 MG PO TABS: 600.0000 mg | ORAL_TABLET | Freq: Four times a day (QID) | ORAL | Status: DC | PRN

## 2012-12-24 MED ORDER — CITRIC ACID-SODIUM CITRATE 334-500 MG/5ML PO SOLN: 30.0000 mL | ORAL | Status: DC | PRN

## 2012-12-24 MED ORDER — FENTANYL 2.5 MCG/ML BUPIVACAINE 1/10 % EPIDURAL INFUSION (WH - ANES)
14.0000 mL/h | INTRAMUSCULAR | Status: DC | PRN
  Administered 2012-12-24 – 2012-12-25 (×2): 14 mL/h via EPIDURAL
  Filled 2012-12-24 (×2): qty 125

## 2012-12-24 MED ORDER — DIPHENHYDRAMINE HCL 50 MG/ML IJ SOLN: 12.5000 mg | INTRAMUSCULAR | Status: DC | PRN

## 2012-12-24 NOTE — Anesthesia Preprocedure Evaluation (Signed)
Anesthesia Evaluation  Patient identified by MRN, date of birth, ID band Patient awake    Reviewed: Allergy & Precautions, H&P , Patient's Chart, lab work & pertinent test results  Airway Mallampati: II TM Distance: >3 FB Neck ROM: full    Dental  (+) Teeth Intact   Pulmonary asthma ,  breath sounds clear to auscultation        Cardiovascular Rhythm:regular Rate:Normal     Neuro/Psych    GI/Hepatic GERD-  Medicated,  Endo/Other    Renal/GU      Musculoskeletal   Abdominal   Peds  Hematology   Anesthesia Other Findings       Reproductive/Obstetrics (+) Pregnancy                           Anesthesia Physical Anesthesia Plan  ASA: III  Anesthesia Plan: Epidural   Post-op Pain Management:    Induction:   Airway Management Planned:   Additional Equipment:   Intra-op Plan:   Post-operative Plan:   Informed Consent: I have reviewed the patients History and Physical, chart, labs and discussed the procedure including the risks, benefits and alternatives for the proposed anesthesia with the patient or authorized representative who has indicated his/her understanding and acceptance.   Dental Advisory Given  Plan Discussed with:   Anesthesia Plan Comments: (Labs checked- platelets confirmed with RN in room. Fetal heart tracing, per RN, reported to be stable enough for sitting procedure. Discussed epidural, and patient consents to the procedure:  included risk of possible headache,backache, failed block, allergic reaction, and nerve injury. This patient was asked if she had any questions or concerns before the procedure started. )        Anesthesia Quick Evaluation

## 2012-12-24 NOTE — MAU Note (Signed)
Manfred Arch CNM notified that patient was transferred to room 163 for labor evaluation.

## 2012-12-24 NOTE — H&P (Signed)
Sylvia Townsend is a 32 y.o. female presenting for labor check, regular painful ctx all day, bloody show, no gush of fluid, GFM.   HPI: Pt began [facility] at 8wks [place] - [facility] 5/8 c/w LMP dating Anatomy US at 18wks WNL Quad screen normal    Maternal Medical History:  Reason for admission: Contractions.   Contractions: Onset was 6-12 hours ago.   Frequency: regular.   Duration is approximately 60 seconds.   Perceived severity is moderate.    Fetal activity: Perceived fetal activity is normal.   Last perceived fetal movement was within the past hour.    Prenatal complications: no prenatal complications   OB History   Grav Para Term Preterm Abortions TAB SAB Ect Mult Living   1 0 0 0 0 0 0 0 0 0      Past Medical History  Diagnosis Date  . Abnormal Pap smear 2004,2005    Repeat  pap after 6 mos. back as normal;Last pap 03/2011;was normal  . Asthma     Induced w/ weather changes;inhaler prn  . Migraines 2012    Was rx'd meds   . Acid reflux     Was rx'd meds  . Heart palpitations 2013    had f/u w/ PCP;thought was related to acid reflux;happens @ night or after eating  . Seizure     x 2 as a Sylvia Townsend   Past Surgical History  Procedure Laterality Date  . Wisdom tooth extraction  2007    all 4 removed  . No past surgeries     Family History: family history includes Asthma in her cousin, other, and sister; Cancer in her paternal grandmother and paternal uncle; Diverticulitis in her mother; Hypertension in her maternal grandfather; Kidney failure in her maternal uncle; Lupus in her sister; Migraines in her father; and Stroke in her maternal grandfather. Social History:  reports that she has never smoked. She has never used smokeless tobacco. She reports that she drinks about 1.0 ounces of alcohol per week. She reports that she does not use illicit drugs.   Prenatal Transfer Tool  Maternal Diabetes: No Genetic Screening: Normal Maternal Ultrasounds/Referrals: Normal Fetal  Ultrasounds or other Referrals:  None Maternal Substance Abuse:  No Significant Maternal Medications:  None Significant Maternal Lab Results:  Lab values include: Group B Strep negative Other Comments:  None  Review of Systems  All other systems reviewed and are negative.    Dilation: 4 Effacement (%): 80 Station: -2 Exam by:: Sylvia Townsend CNM  Blood pressure 130/82, pulse 86, temperature 98.6 F (37 C), temperature source Oral, resp. rate 20, height 5\' 3"  (1.6 Townsend), weight 215 lb (97.523 kg), last menstrual period 03/24/2012. Maternal Exam:  Uterine Assessment: Contraction strength is mild.  Contraction duration is 60 seconds. Contraction frequency is regular.   Abdomen: Patient reports no abdominal tenderness. Fundal height is aga.   Estimated fetal weight is 7-8#.   Fetal presentation: vertex  Introitus: Normal vulva. Normal vagina.  Pelvis: adequate for delivery.   Cervix: Cervix evaluated by digital exam.     Fetal Exam Fetal Monitor Review: Mode: ultrasound.   Baseline rate: 140.  Variability: moderate (6-25 bpm).   Pattern: accelerations present and no decelerations.    Fetal State Assessment: Category I - tracings are normal.     Physical Exam  Nursing note and vitals reviewed. Constitutional: She is oriented to person, place, and time. She appears well-developed and well-nourished.  HENT:  Head: Normocephalic.  Eyes: Pupils are equal,  round, and reactive to light.  Neck: Normal range of motion.  Cardiovascular: Normal rate, regular rhythm and normal heart sounds.   Respiratory: Effort normal and breath sounds normal.  GI: Soft. Bowel sounds are normal.  Genitourinary: Vagina normal.  Musculoskeletal: Normal range of motion.  Neurological: She is alert and oriented to person, place, and time. She has normal reflexes.  Skin: Skin is warm and dry.  Psychiatric: She has a normal mood and affect. Her behavior is normal.    Prenatal labs: ABO, Rh: B/POS/--  (09/19 1540) Antibody: NEG (09/19 1540) Rubella: 48.4 (09/19 1540) RPR: NON REAC (02/03 1530)  HBsAg: NEGATIVE (09/19 1540)  HIV: NON REACTIVE (09/19 1540)  GBS:   neg Pap/GC/CT - neg Hemoglobin electrophoresis - WNL Quad screen WNL 1hr gtt 103  Assessment/Plan: IUP at [redacted]w[redacted]d FHR reassuring GBS neg Early/active labor  Admit to b.s per c/w Sylvia Townsend Routine L&D orders Epidural ASAP Expectant management    Sylvia Townsend 12/24/2012, 7:30 PM

## 2012-12-24 NOTE — Anesthesia Procedure Notes (Addendum)

## 2012-12-25 ENCOUNTER — Encounter (HOSPITAL_COMMUNITY): Payer: Self-pay | Admitting: *Deleted

## 2012-12-25 LAB — RPR: RPR Ser Ql: NONREACTIVE

## 2012-12-25 LAB — ABO/RH: ABO/RH(D): B POS

## 2012-12-25 MED ORDER — SIMETHICONE 80 MG PO CHEW: 80.0000 mg | CHEWABLE_TABLET | ORAL | Status: DC | PRN

## 2012-12-25 MED ORDER — BISACODYL 10 MG RE SUPP
10.0000 mg | Freq: Every day | RECTAL | Status: DC | PRN
  Filled 2012-12-25: qty 1

## 2012-12-25 MED ORDER — ZOLPIDEM TARTRATE 5 MG PO TABS: 5.0000 mg | ORAL_TABLET | Freq: Every evening | ORAL | Status: DC | PRN

## 2012-12-25 MED ORDER — BENZOCAINE-MENTHOL 20-0.5 % EX AERO
1.0000 "application " | INHALATION_SPRAY | CUTANEOUS | Status: DC | PRN
  Administered 2012-12-25: 1 via TOPICAL
  Filled 2012-12-25: qty 56

## 2012-12-25 MED ORDER — TERBUTALINE SULFATE 1 MG/ML IJ SOLN: 0.2500 mg | Freq: Once | INTRAMUSCULAR | Status: DC | PRN

## 2012-12-25 MED ORDER — ONDANSETRON HCL 4 MG PO TABS: 4.0000 mg | ORAL_TABLET | ORAL | Status: DC | PRN

## 2012-12-25 MED ORDER — PNEUMOCOCCAL VAC POLYVALENT 25 MCG/0.5ML IJ INJ
0.5000 mL | INJECTION | INTRAMUSCULAR | Status: AC
  Administered 2012-12-26: 0.5 mL via INTRAMUSCULAR
  Filled 2012-12-25: qty 0.5

## 2012-12-25 MED ORDER — FLEET ENEMA 7-19 GM/118ML RE ENEM: 1.0000 | ENEMA | Freq: Every day | RECTAL | Status: DC | PRN

## 2012-12-25 MED ORDER — TETANUS-DIPHTH-ACELL PERTUSSIS 5-2.5-18.5 LF-MCG/0.5 IM SUSP
0.5000 mL | Freq: Once | INTRAMUSCULAR | Status: AC
  Administered 2012-12-26: 0.5 mL via INTRAMUSCULAR
  Filled 2012-12-25: qty 0.5

## 2012-12-25 MED ORDER — OXYTOCIN 40 UNITS IN LACTATED RINGERS INFUSION - SIMPLE MED
1.0000 m[IU]/min | INTRAVENOUS | Status: DC
  Administered 2012-12-25: 2 m[IU]/min via INTRAVENOUS
  Filled 2012-12-25: qty 1000

## 2012-12-25 MED ORDER — PRENATAL MULTIVITAMIN CH
1.0000 | ORAL_TABLET | Freq: Every day | ORAL | Status: DC
  Administered 2012-12-25 – 2012-12-26 (×2): 1 via ORAL
  Filled 2012-12-25: qty 1

## 2012-12-25 MED ORDER — MEASLES, MUMPS & RUBELLA VAC ~~LOC~~ INJ
0.5000 mL | INJECTION | Freq: Once | SUBCUTANEOUS | Status: DC
  Filled 2012-12-25: qty 0.5

## 2012-12-25 MED ORDER — LANOLIN HYDROUS EX OINT: TOPICAL_OINTMENT | CUTANEOUS | Status: DC | PRN

## 2012-12-25 MED ORDER — ONDANSETRON HCL 4 MG/2ML IJ SOLN: 4.0000 mg | INTRAMUSCULAR | Status: DC | PRN

## 2012-12-25 MED ORDER — DIPHENHYDRAMINE HCL 25 MG PO CAPS: 25.0000 mg | ORAL_CAPSULE | Freq: Four times a day (QID) | ORAL | Status: DC | PRN

## 2012-12-25 MED ORDER — WITCH HAZEL-GLYCERIN EX PADS: 1.0000 "application " | MEDICATED_PAD | CUTANEOUS | Status: DC | PRN

## 2012-12-25 MED ORDER — MEDROXYPROGESTERONE ACETATE 150 MG/ML IM SUSP: 150.0000 mg | INTRAMUSCULAR | Status: DC | PRN

## 2012-12-25 MED ORDER — OXYCODONE-ACETAMINOPHEN 5-325 MG PO TABS
1.0000 | ORAL_TABLET | ORAL | Status: DC | PRN
  Administered 2012-12-26 (×2): 1 via ORAL
  Filled 2012-12-25 (×3): qty 1

## 2012-12-25 MED ORDER — IBUPROFEN 600 MG PO TABS
600.0000 mg | ORAL_TABLET | Freq: Four times a day (QID) | ORAL | Status: DC
  Administered 2012-12-25 – 2012-12-26 (×6): 600 mg via ORAL
  Filled 2012-12-25 (×6): qty 1

## 2012-12-25 MED ORDER — SENNOSIDES-DOCUSATE SODIUM 8.6-50 MG PO TABS
2.0000 | ORAL_TABLET | Freq: Every day | ORAL | Status: DC
  Administered 2012-12-25: 2 via ORAL

## 2012-12-25 MED ORDER — DIBUCAINE 1 % RE OINT: 1.0000 "application " | TOPICAL_OINTMENT | RECTAL | Status: DC | PRN

## 2012-12-25 NOTE — Progress Notes (Signed)
Patient ID: Sylvia Townsend, female   DOB: Dec 12, 1980, 32 y.o.   MRN: [id number] Manpreet Kemmer is a 32 y.o. G1P0000 at [redacted]w[redacted]d admitted for LABOR  Subjective: Overall comfortable w epidural, c/o more rectal pressure now,   Objective: BP 120/82  Pulse 84  Temp(Src) 99 F (37.2 C) (Axillary)  Resp 18  Ht 5\' 3"  (1.6 m)  Wt 215 lb (97.523 kg)  BMI 38.09 kg/m2  SpO2 100%  LMP 03/24/2012     FHT:  FHR: 140 bpm, variability: moderate,  accelerations:  Present,  decelerations:  Present variables UC:   regular, every 2-3 minutes SVE:   8-9//100/0 Blood in IUPC line, will attempt to flush Cervix more on R side   Assessment / Plan: progressive labor  Labor: Progressing normally Preeclampsia:  no signs or symptoms of toxicity Fetal Wellbeing:  Category II Pain Control:  Epidural Anticipated MOD:  NSVD  Continue titrate pitocin, and maternal position changes for fetal rotation  Recheck 2hrs or PRN   Dr Pennie Rushing updated about 230am    Fionnuala Hemmerich M 12/25/2012, 3:28 AM

## 2012-12-25 NOTE — Anesthesia Postprocedure Evaluation (Signed)
Anesthesia Post Note  Patient: Sylvia Townsend  Procedure(s) Performed: * No procedures listed *  Anesthesia type: Epidural  Patient location: Mother/Baby  Post pain: Pain level controlled  Post assessment: Post-op Vital signs reviewed  Last Vitals:  Filed Vitals:   12/25/12 0820  BP: 113/78  Pulse: 101  Temp: 36.7 C  Resp: 18    Post vital signs: Reviewed  Level of consciousness:alert  Complications: No apparent anesthesia complications

## 2012-12-25 NOTE — Progress Notes (Signed)
Patient ID: Sylvia Townsend, female   DOB: [birth year], 32 y.o.   MRN: [id number] Sylvia Townsend is a 32 y.o. G1P0000 at [redacted]w[redacted]d admitted for labor  Subjective: Comfortable w epidural now  Objective: BP 125/76  Pulse 71  Temp(Src) 98.3 F (36.8 C) (Oral)  Resp 18  Ht 5\' 3"  (1.6 m)  Wt 215 lb (97.523 kg)  BMI 38.09 kg/m2  SpO2 100%  LMP 03/24/2012     FHT:  FHR: 120 bpm, variability: moderate,  accelerations:  Present,  decelerations:  Absent UC:   regular, every 3-5  minutes SVE:   Dilation: 5 Effacement (%): 80 Station: -1 Exam by:: S. Shirin Echeverry, CNM  Attempt to AROM, w no obvious leakage, lg amt mucous bloody show, pt denies feeling any gush of fluid today   Assessment / Plan: early/active labor  Labor: progressing Preeclampsia:  no signs or symptoms of toxicity Fetal Wellbeing:  Category I Pain Control:  Epidural Anticipated MOD:  NSVD  Recheck in about 2 hours, consider IUPC/augmentation PRN   Update physician PRN   Shaquita Fort M 12/25/2012, 12:14 AM

## 2012-12-25 NOTE — Progress Notes (Signed)
Patient ID: Sylvia Townsend, female   DOB: [birth year], 32 y.o.   MRN: [id number] Captola Teschner is a 32 y.o. G1P0000 at [redacted]w[redacted]d admitted for early labor  Subjective: Comfortable w epidural   Objective: BP 125/76  Pulse 71  Temp(Src) 98.3 F (36.8 C) (Oral)  Resp 18  Ht 5\' 3"  (1.6 m)  Wt 215 lb (97.523 kg)  BMI 38.09 kg/m2  SpO2 100%  LMP 03/24/2012     FHT:  FHR: 120 bpm, variability: moderate,  accelerations:  Present,  decelerations:  Absent UC:   irregular, every 2-5  minutes SVE:   Dilation: 5 Effacement (%): 80 Station: -1 Exam by:: S. Niaomi Cartaya, CNM  No cervical change, some mucous bloody show, no obvious ROM IUPC placed without difficulty vtx asynclitic   Assessment / Plan: Protracted active phase  Labor: begin pitocin augmentation  Preeclampsia:  no signs or symptoms of toxicity Fetal Wellbeing:  Category I Pain Control:  Epidural Anticipated MOD:  NSVD  Recheck after 2 hours adequate MVU's, or prn Continue frequent position changes to facilitate fetal descent/rotation   Update physician PRN   Malissa Hippo 12/25/2012, 12:10 AM

## 2012-12-26 LAB — CBC
HCT: 30.3 % — ABNORMAL LOW (ref 36.0–46.0)
Hemoglobin: 10.2 g/dL — ABNORMAL LOW (ref 12.0–15.0)
MCV: 82.1 fL (ref 78.0–100.0)
RBC: 3.69 MIL/uL — ABNORMAL LOW (ref 3.87–5.11)
RDW: 13.7 % (ref 11.5–15.5)
WBC: 16.2 10*3/uL — ABNORMAL HIGH (ref 4.0–10.5)

## 2012-12-26 MED ORDER — NORETHINDRONE 0.35 MG PO TABS: 1.0000 | ORAL_TABLET | Freq: Every day | ORAL | Status: DC

## 2012-12-26 MED ORDER — OXYCODONE-ACETAMINOPHEN 5-325 MG PO TABS: 1.0000 | ORAL_TABLET | ORAL | Status: DC | PRN

## 2012-12-26 MED ORDER — IBUPROFEN 600 MG PO TABS: 600.0000 mg | ORAL_TABLET | Freq: Four times a day (QID) | ORAL | Status: DC | PRN

## 2012-12-26 NOTE — Discharge Summary (Signed)
  Vaginal Delivery Discharge Summary  Sylvia Townsend  DOB:    [birth year] MRN:    [id number] CSN:    [id number]  Date of admission:                  12/24/12  Date of discharge:                   12/26/12  Procedures this admission:  Date of Delivery: 12/24/12  Newborn Data:  Live born female  Birth Weight: 7 lb 13.6 oz (3561 g) APGAR: 9, 9  Home with mother.  Circumcision Plan: Inpatient--baby has not voided yet on 12/26/12, so circumcision will be delayed until that has occurred.  History of Present Illness:  [name] is a 32 y.o. female, G1P1001, who presents at [redacted]w[redacted]d weeks gestation. The patient has been followed at the [facility]. She was admitted onset of labor. Her pregnancy has been complicated by:  Patient Active Problem List   Diagnosis Date Noted  . NSVD (normal spontaneous vaginal delivery) 12/25/2012  . Third degree perineal laceration 12/25/2012  . History of gastroesophageal reflux (GERD) 07/04/2012  . Exercise-induced asthma 06/06/2012  . Yeast vaginitis 05/24/2012  . Pregnant state, incidental 05/24/2012     Hospital course:  The patient was admitted in active labor.   Her labor was not complicated. She proceeded to have a vaginal delivery of a healthy infant. Her delivery was not complicated, other than a 3rd degree laceration. Her postpartum course was not complicated.  She was discharged to home on postpartum day 1 doing well.    Feeding:  breast  Contraception:  oral progesterone-only contraceptive  Discharge hemoglobin:  Hemoglobin  Date Value Range Status  12/26/2012 10.2* 12.0 - 15.0 g/dL Final     DELTA CHECK NOTED     REPEATED TO VERIFY     HCT  Date Value Range Status  12/26/2012 30.3* 36.0 - 46.0 % Final    Discharge Physical Exam:   General: alert Lochia: appropriate Uterine Fundus: firm Incision: healing well DVT Evaluation: No evidence of DVT  seen on physical exam. Negative Homan's sign.  Intrapartum Procedures: spontaneous vaginal delivery Postpartum Procedures: none Complications-Operative and Postpartum: 3rd degree perineal laceration  Discharge Diagnoses: Term Pregnancy-delivered, 3rd degree laceration  Discharge Information:  Activity:           Per [facility] handout Diet:                routine Medications: Ibuprofen, Percocet and Micronor--stool softeners prn Condition:      stable Instructions:  refer to practice specific booklet Discharge to: home  Follow-up Information   Follow up with [facility]. Schedule an appointment as soon as possible for a visit in 6 weeks. (Call for questions or concerns.)    Contact information:   [address] [place] [address] [phone]       Nigel Bridgeman 12/26/2012

## 2014-06-25 ENCOUNTER — Encounter (HOSPITAL_COMMUNITY): Payer: Self-pay | Admitting: *Deleted

## 2016-08-24 NOTE — L&D Delivery Note (Signed)
Delivery Note At  a viable female was delivered via  (Presentation:;  ).  APGAR:9  9 ; weight pending  .   Placenta status:complete , .3V  Cord:  with the following complications:None .    Anesthesia:  Epidrural Episiotomy:  None Lacerations:  1st Suture Repair: 4.0 vicryl Est. Blood Loss (mL):    Mom to postpartum.  Baby to Couplet care / Skin to Skin.  Sylvia Townsend 08/22/2017, 8:50 AM

## 2016-10-27 ENCOUNTER — Ambulatory Visit (INDEPENDENT_AMBULATORY_CARE_PROVIDER_SITE_OTHER): Admitting: Family Medicine

## 2016-10-27 ENCOUNTER — Encounter: Payer: Self-pay | Admitting: Family Medicine

## 2016-10-27 VITALS — BP 120/80 | HR 74 | Temp 98.6°F | Ht 63.5 in | Wt 178.0 lb

## 2016-10-27 DIAGNOSIS — Z23 Encounter for immunization: Secondary | ICD-10-CM | POA: Diagnosis not present

## 2016-10-27 DIAGNOSIS — R079 Chest pain, unspecified: Secondary | ICD-10-CM | POA: Diagnosis not present

## 2016-10-27 NOTE — Progress Notes (Signed)
Subjective:  Patient ID: Sylvia Townsend, female    DOB: 1980-12-23  Age: 36 y.o. MRN: [id number]  CC: Chest pain  HPI Sylvia Townsend is a 36 y.o. female presents to the clinic today as a new patient with complaints of chest pain.  Chest pain  Patient reports that she's had a few episodes of left-sided chest pain over the past few weeks.  Described as sharp in character.  Lasts for seconds and then resolves.  Worse with deep breathing.  No associated shortness breath.  Has been intermittent over the past. Severity: 4-5/10.  No known relieving factors.  No reports of increase in stress.  No reports of anxiety.  She has increased her physical activity recently and has been doing more pushups.  Does not occur with exertion. No radiation. No diaphoresis. No palpitations.  No other associated symptoms. No other complaints at this time.  PMH, Surgical Hx, Family Hx, Social History reviewed and updated as below.  Past Medical History:  Diagnosis Date  . Abnormal Pap smear 2004,2005   Hx of.  . Acid reflux   . Asthma    Induced w/ weather changes;inhaler prn  . Migraines 2012  . Seizure (HCC)    x 2 as a Toddler   Past Surgical History:  Procedure Laterality Date  . WISDOM TOOTH EXTRACTION  2007   all 4 removed   Family History  Problem Relation Age of Onset  . Stroke Maternal Grandfather   . Hypertension Maternal Grandfather   . Cancer Paternal Uncle     Throat  . Cancer Paternal Grandmother     Unsure which type  . Migraines Father   . Asthma Sister     x 2  . Asthma Cousin     Maternal  . Asthma Other     Nephew  . Kidney failure Maternal Uncle   . Lupus Sister   . Diverticulitis Mother   . Stroke Maternal Grandmother    Social History  Substance Use Topics  . Smoking status: Never Smoker  . Smokeless tobacco: Never Used  . Alcohol use 1.0 oz/week    2 Standard drinks or equivalent per week     Comment: socially    Review of  Systems  Cardiovascular: Positive for chest pain.  All other systems reviewed and are negative.  Objective:   Today's Vitals: BP 120/80   Pulse 74   Temp 98.6 F (37 C) (Oral)   Ht 5' 3.5" (1.613 m)   Wt 178 lb (80.7 kg)   SpO2 100%   BMI 31.04 kg/m   Physical Exam  Constitutional: She is oriented to person, place, and time. She appears well-developed and well-nourished. No distress.  HENT:  Head: Normocephalic and atraumatic.  Nose: Nose normal.  Mouth/Throat: Oropharynx is clear and moist. No oropharyngeal exudate.  Normal TM's bilaterally.   Eyes: Conjunctivae are normal. No scleral icterus.  Neck: Neck supple.  Cardiovascular: Normal rate and regular rhythm.   1-2/6 systolic murmur.  Pulmonary/Chest: Effort normal and breath sounds normal. She has no wheezes. She has no rales.  Abdominal: Soft. She exhibits no distension. There is no tenderness. There is no rebound and no guarding.  Musculoskeletal: Normal range of motion. She exhibits no edema.  Lymphadenopathy:    She has no cervical adenopathy.  Neurological: She is alert and oriented to person, place, and time.  Skin: Skin is warm and dry. No rash noted.  Psychiatric: She has a normal mood and affect.  Vitals reviewed.  Assessment & Plan:   Problem List Items Addressed This Visit    Chest pain - Primary    New problem. Suspect musculoskeletal etiology given presentation and increase in physical activity. Does not appear to be cardiac in nature. Supportive care with change in physical activity/exercise. PRN Ibuprofen.       Other Visit Diagnoses    Encounter for immunization       Relevant Orders   Flu Vaccine QUAD 36+ mos IM (Completed)     Follow-up: PRN  Everlene OtherJayce Horton Ellith[place]pe DO [facility]

## 2016-10-27 NOTE — Assessment & Plan Note (Signed)
New problem. Suspect musculoskeletal etiology given presentation and increase in physical activity. Does not appear to be cardiac in nature. Supportive care with change in physical activity/exercise. PRN Ibuprofen.

## 2016-10-27 NOTE — Patient Instructions (Signed)
Everything appears well.    Follow up annually.  If this recurs or worsens please let me know.  Take care  Dr. Adriana Simasook

## 2016-11-16 ENCOUNTER — Telehealth: Payer: Self-pay | Admitting: Family Medicine

## 2016-11-16 NOTE — Telephone Encounter (Signed)
Disregard note pt is coming in tomorrow to see Dr Adriana Simasook. Pt was still transferred to team health. Thank you!

## 2016-11-16 NOTE — Telephone Encounter (Signed)
Pt called about having right knee swellen since Friday afternoon. It went down over night she had it lifted up. Saturday morning it started swell again. It is also painful. Pt has a appt for today at 1:30pm with Arnett. Dr Adriana Simasook did not have a 30min appt. So I scheduled pt with Arnett. Thank you!

## 2016-11-17 ENCOUNTER — Ambulatory Visit (INDEPENDENT_AMBULATORY_CARE_PROVIDER_SITE_OTHER): Admitting: Family Medicine

## 2016-11-17 ENCOUNTER — Ambulatory Visit: Admitting: Family Medicine

## 2016-11-17 ENCOUNTER — Encounter: Payer: Self-pay | Admitting: Family Medicine

## 2016-11-17 DIAGNOSIS — M25561 Pain in right knee: Secondary | ICD-10-CM

## 2016-11-17 DIAGNOSIS — M25461 Effusion, right knee: Secondary | ICD-10-CM | POA: Insufficient documentation

## 2016-11-17 MED ORDER — MELOXICAM 15 MG PO TABS: 15.0000 mg | ORAL_TABLET | Freq: Every day | ORAL | 0 refills | Status: DC

## 2016-11-17 NOTE — Assessment & Plan Note (Signed)
New problem. Suspect meniscal injury based on history.  Advised rest, ice, compression, elevation. Mobic daily over the next 1-2 weeks. Referring to sports medicine for US.

## 2016-11-17 NOTE — Progress Notes (Signed)
Subjective:  Patient ID: Sylvia Townsend, female    DOB: 02/06/1981  Age: 36 y.o. MRN: [id number]  CC: R knee pain/swelling  HPI:  36 year old female presents with the above complaint.  Patient reports that on Wednesday she was squatting down and suddenly stood up and heard a "pop". She reports that she had severe right knee pain at that time. She states she had difficulty straightening out her leg. Her husband helped her straight her leg and she felt like something popped back in place. She does not recall her patella being out of place. She reports associated swelling. She's had some improvement in her pain but it still persists. Pain is located particularly on the lateral side of the knee. She also reports pain proximally and distally. She continues to have associated swelling. She has been elevating and icing the area. Everything seems to be worse with physical activity/being on her feet.   Social Hx   Social History   Social History  . Marital status: Married    Spouse name: N/A  . Number of children: N/A  . Years of education: N/A   Occupational History  . [employer]   Social History Main Topics  . Smoking status: Never Smoker  . Smokeless tobacco: Never Used  . Alcohol use 1.0 oz/week    2 Standard drinks or equivalent per week     Comment: socially   . Drug use: No  . Sexual activity: Yes    Partners: Male    Birth control/ protection: Pill   Other Topics Concern  . None   Social History Narrative  . None    Review of Systems  Constitutional: Negative.   Musculoskeletal:       R knee pain and swelling.   Objective:  BP 108/84 (BP Location: Left Arm, Patient Position: Sitting, Cuff Size: Normal)   Pulse 85   Temp 98.3 F (36.8 C) (Oral)   Resp 16   Wt 173 lb 2 oz (78.5 kg)   SpO2 98%   BMI 30.19 kg/m   BP/Weight 11/17/2016 10/27/2016 12/26/2012  Systolic BP 108 120 97  Diastolic BP 84 80 64  Wt. (Lbs) 173.13 178 -  BMI  30.19 31.04 -    Physical Exam  Constitutional: She is oriented to person, place, and time. She appears well-developed. No distress.  Pulmonary/Chest: Effort normal.  Musculoskeletal:  Knee: Right Effusion noted. Palpation with lateral tenderness.  Ligaments with solid consistent endpoints including ACL, PCL, LCL, MCL. Non painful patellar compression. Patellar glide without crepitus. Patellar and quadriceps tendons unremarkable.   Neurological: She is alert and oriented to person, place, and time.  Psychiatric: She has a normal mood and affect.  Vitals reviewed.   Lab Results  Component Value Date   WBC 16.2 (H) 12/26/2012   HGB 10.2 (L) 12/26/2012   HCT 30.3 (L) 12/26/2012   PLT 155 12/26/2012    Assessment & Plan:   Problem List Items Addressed This Visit    Right knee pain    New problem. Suspect meniscal injury based on history.  Advised rest, ice, compression, elevation. Mobic daily over the next 1-2 weeks. Referring to sports medicine for US.      Relevant Orders   Ambulatory referral to Sports Medicine      Meds ordered this encounter  Medications  . meloxicam (MOBIC) 15 MG tablet    Sig: Take 1 tablet (15 mg total)  by mouth daily.    Dispense:  14 tablet    Refill:  0    Follow-up: PRN  Everlene Other DO [facility]

## 2016-11-17 NOTE — Patient Instructions (Signed)
Rest, ice, compression, elevated.  Mobic daily for the next 1-2 weeks.  We will arrange the referral for you to have an ultrasound.  Take care  Dr. Adriana Simasook

## 2016-11-23 ENCOUNTER — Ambulatory Visit: Admitting: Family

## 2016-11-25 ENCOUNTER — Ambulatory Visit (INDEPENDENT_AMBULATORY_CARE_PROVIDER_SITE_OTHER): Admitting: Sports Medicine

## 2016-11-25 ENCOUNTER — Encounter: Payer: Self-pay | Admitting: Sports Medicine

## 2016-11-25 ENCOUNTER — Ambulatory Visit: Payer: Self-pay

## 2016-11-25 VITALS — BP 114/80 | HR 73 | Ht 63.0 in | Wt 177.6 lb

## 2016-11-25 DIAGNOSIS — M25561 Pain in right knee: Secondary | ICD-10-CM

## 2016-11-25 DIAGNOSIS — M25461 Effusion, right knee: Secondary | ICD-10-CM | POA: Diagnosis not present

## 2016-11-25 NOTE — Patient Instructions (Signed)
Please perform the exercise program that Sylvia Townsend has prepared for you and gone over in detail on a daily basis.  In addition to the handout you were provided you can access your program through: [email or web address]   Your unique program code is: [email or web address]

## 2016-11-25 NOTE — Progress Notes (Signed)
OFFICE VISIT NOTE Sylvia Townsend. [name] Sports Medicine [facility] at [facility] [phone]  [name] - 36 y.o. female MRN [id number]  Date of birth: Nov 30, 1980  Visit Date: 11/25/2016  PCP: [name], DO   Referred by: [name], DO  SUBJECTIVE:   Chief Complaint  Patient presents with  . pain in right knee    right knee pain x 1.5 weeks. Pt was playing with son on floor and felt knee "slide". This happened again when she was cleaning her shower. She has swellling in the right knee. She has no pain when wearing brace but feels unstable when brace is off. She has occasional pain when bending the knee is about a 4. Discomfort is mostly on the lateral side of the knee. She has not had an imaging done.    HPI: As above. Additional pertinent information includes:  HPI ROS: ROS  Otherwise per HPI.  HISTORY & PERTINENT PRIOR DATA:  No specialty comments available. She reports that she has never smoked. She has never used smokeless tobacco. No results for input(s): HGBA1C, LABURIC in the last 8760 hours. Medications & Allergies reviewed per EMR Patient Active Problem List   Diagnosis Date Noted  . Pain and swelling of knee, right 11/17/2016  . History of gastroesophageal reflux (GERD) 07/04/2012  . Exercise-induced asthma 06/06/2012   Past Medical History:  Diagnosis Date  . Abnormal Pap smear 2004,2005   Hx of.  . Acid reflux   . Asthma    Induced w/ weather changes;inhaler prn  . Migraines 2012  . Seizure (HCC)    x 2 as a Toddler   Family History  Problem Relation Age of Onset  . Stroke Maternal Grandfather   . Hypertension Maternal Grandfather   . Cancer Paternal Uncle     Throat  . Cancer Paternal Grandmother     Unsure which type  . Migraines Father   . Asthma Sister     x 2  . Asthma Cousin     Maternal  . Asthma Other     Nephew  . Kidney failure Maternal Uncle   . Lupus Sister   . Diverticulitis Mother   .  Stroke Maternal Grandmother    Past Surgical History:  Procedure Laterality Date  . WISDOM TOOTH EXTRACTION  2007   all 4 removed   Social History   Occupational History  . [employer]   Social History Main Topics  . Smoking status: Never Smoker  . Smokeless tobacco: Never Used  . Alcohol use 1.0 oz/week    2 Standard drinks or equivalent per week     Comment: socially   . Drug use: No  . Sexual activity: Yes    Partners: Male    Birth control/ protection: Pill    OBJECTIVE:  VS:  HT:5\' 3"  (160 cm)   WT:177 lb 9.6 oz (80.6 kg)  BMI:31.5    BP:114/80  HR:73bpm  TEMP: ( )  RESP:100 % Physical Exam WDWN, NAD, Non-toxic appearing Alert & appropriately interactive Not depressed or anxious appearing No increased work of breathing. Pupils are equal. EOM intact without nystagmus No clubbing or cyanosis of the extremities appreciated No significant rashes/lesions/ulcerations overlying the examined area. DP & PT pulses 2+/4.  No significant pretibial edema. Sensation intact to light touch in upper and lower extremities. RIGHT Knee:  Overall joint is well aligned, no significant deformity.  Generalized synovitis with a small amount of swelling.    ROM: 0 to 120.    Extensor mechanism intact  Generalized TTP over the medial and lateral joint line.  Pain with patellar grind..    Stable to varus/valgus strain & anterior/posterior drawer.  Normal Lachman's.    Pain with McMurray's.  Pain with Thessaly..      IMAGING & PROCEDURES: No results found. No additional findings.  LIMITED MSK ULTRASOUND OF RIGHT KNEE Images were obtained and interpreted by myself, Gaspar Bidding, DO  Images have been saved and stored to PACS system. Images obtained on: GE S7 Ultrasound machine  FINDINGS:  Patella & Patellar Tendon: Normal Quad & Quad Tendon: Normal Suprapatellar Pouch: small supraphysiologic effusion Medial Joint Line: Normal medial  meniscus Lateral Joint Line: Small amount of neovascularity with bulging of the lateral meniscus Trochlea: Fissuring of the trochlea and retropatellar fat pad Posterior knee: n/  IMPRESSION:  1. Lateral meniscal tear with retropatellar fat pad splitting.   +++++++++++++++++++++++++++++++++++++++++++++++++++++++++++++++ PROCEDURE NOTE: THERAPEUTIC EXERCISES (97110) 15 minutes spent for Therapeutic exercises as stated in above notes.  This included exercises focusing on stretching, strengthening, with significant focus on eccentric aspects.   Proper technique shown and discussed handout in great detail with ATC.  All questions were discussed and answered.    ASSESSMENT & PLAN:  Visit Diagnoses:  1. Pain and swelling of knee, right    Meds: No orders of the defined types were placed in this encounter.   Orders:  Orders Placed This Encounter  Procedures  . [place] LIMITED JOINT SPACE STRUCTURES LOW RIGHT(NO LINKED CHARGES)    Follow-up: Return in about 6 weeks (around 01/06/2017).   Otherwise please see problem oriented charting as below.

## 2016-12-18 LAB — OB RESULTS CONSOLE HEPATITIS B SURFACE ANTIGEN: Hepatitis B Surface Ag: NEGATIVE

## 2016-12-18 LAB — OB RESULTS CONSOLE ANTIBODY SCREEN: Antibody Screen: NEGATIVE

## 2016-12-18 LAB — OB RESULTS CONSOLE ABO/RH: RH TYPE: POSITIVE

## 2016-12-18 LAB — OB RESULTS CONSOLE GC/CHLAMYDIA
Chlamydia: NEGATIVE
GC PROBE AMP, GENITAL: NEGATIVE

## 2016-12-18 LAB — OB RESULTS CONSOLE RPR: RPR: NONREACTIVE

## 2016-12-18 LAB — OB RESULTS CONSOLE RUBELLA ANTIBODY, IGM: Rubella: IMMUNE

## 2016-12-18 LAB — OB RESULTS CONSOLE HIV ANTIBODY (ROUTINE TESTING): HIV: NONREACTIVE

## 2016-12-28 NOTE — Assessment & Plan Note (Signed)
Continue with anti-inflammatories Therapeutic exercises reviewed. Discussed injection but patient would like to defer this at this time.  Happy to inject her at any time.  Recheck in 6 weeks.

## 2017-01-06 ENCOUNTER — Ambulatory Visit (INDEPENDENT_AMBULATORY_CARE_PROVIDER_SITE_OTHER): Admitting: Sports Medicine

## 2017-01-06 ENCOUNTER — Encounter: Payer: Self-pay | Admitting: Sports Medicine

## 2017-01-06 VITALS — BP 110/82 | HR 82 | Ht 63.0 in | Wt 178.0 lb

## 2017-01-06 DIAGNOSIS — M25561 Pain in right knee: Secondary | ICD-10-CM

## 2017-01-06 DIAGNOSIS — M25461 Effusion, right knee: Secondary | ICD-10-CM | POA: Diagnosis not present

## 2017-01-06 DIAGNOSIS — Z3A08 8 weeks gestation of pregnancy: Secondary | ICD-10-CM | POA: Diagnosis not present

## 2017-01-06 NOTE — Progress Notes (Signed)
OFFICE VISIT NOTE Sylvia Townsend. [name] Sports Medicine [facility] at [facility] [phone]  Sylvia Townsend - 36 y.o. female MRN [id number]  Date of birth: 1981-04-09  Visit Date: 01/06/2017  PCP: [name], DO   Referred by: [name], DO  [name], CMA acting as scribe for Dr. [name].  SUBJECTIVE:   Chief Complaint  Patient presents with  . Follow-up    right knee pain   HPI: As below and per problem based documentation when appropriate.  Pt present today to follow-up on right knee pain Pain started about 1.5 months ago Pt felt her knee "slide" while she was playing on the floor with her son and then again when she was cleaning the shower. Pt has not felt this sensation since her last OV. She reports that the knee feels much better.  The pain is described as sharp pain on lateral side of right leg when carrying something heavy up the stairs, no pain since then and is rated as 0/10.  Worsened with lifting heavy loads Improves with wearing brace, being aware of her knee while walking, walks about 1 mile daily.  Therapies tried include exercises, brace  Other associated symptoms include: none  Pt had u/s of the knee on 11/25/16  Pt is currently [redacted] weeks pregnant.  Denies any catching, locking or giving way.    Review of Systems  Constitutional: Negative for chills and fever.  Respiratory: Positive for shortness of breath (pt doesn't feel this is related to her knee).   Cardiovascular: Negative for chest pain and leg swelling.  Gastrointestinal: Positive for diarrhea (for a few days but this has resolved). Negative for constipation.  Musculoskeletal: Negative for falls.  Neurological: Negative for dizziness and headaches.    Otherwise per HPI.  HISTORY & PERTINENT PRIOR DATA:  No specialty comments available. She reports that she has never smoked. She has never used smokeless tobacco. No results for input(s): HGBA1C, LABURIC in  the last 8760 hours. Medications & Allergies reviewed per EMR Patient Active Problem List   Diagnosis Date Noted  . Pain and swelling of knee, right 11/17/2016  . History of gastroesophageal reflux (GERD) 07/04/2012  . Exercise-induced asthma 06/06/2012   Past Medical History:  Diagnosis Date  . Abnormal Pap smear 2004,2005   Hx of.  . Acid reflux   . Asthma    Induced w/ weather changes;inhaler prn  . Migraines 2012  . Seizure (HCC)    x 2 as a Toddler   Family History  Problem Relation Age of Onset  . Stroke Maternal Grandfather   . Hypertension Maternal Grandfather   . Cancer Paternal Uncle        Throat  . Cancer Paternal Grandmother        Unsure which type  . Migraines Father   . Asthma Sister        x 2  . Asthma Cousin        Maternal  . Asthma Other        Nephew  . Kidney failure Maternal Uncle   . Lupus Sister   . Diverticulitis Mother   . Stroke Maternal Grandmother    Past Surgical History:  Procedure Laterality Date  . WISDOM TOOTH EXTRACTION  2007   all 4 removed   Social History   Occupational History  . [employer]    [employer]   Social History Main Topics  . Smoking  status: Never Smoker  . Smokeless tobacco: Never Used  . Alcohol use 1.0 oz/week    2 Standard drinks or equivalent per week     Comment: socially   . Drug use: No  . Sexual activity: Yes    Partners: Male    Birth control/ protection: Pill    OBJECTIVE:  VS:  HT:5\' 3"  (160 cm)   WT:178 lb (80.7 kg)  BMI:31.6    BP:110/82  HR:82bpm  TEMP: ( )  RESP:99 % EXAM: Findings:  WDWN, NAD, Non-toxic appearing Alert & appropriately interactive Not depressed or anxious appearing No increased work of breathing. Pupils are equal. EOM intact without nystagmus No clubbing or cyanosis of the extremities appreciated No significant rashes/lesions/ulcerations overlying the examined area. DP & PT pulses 2+/4.  No significant pretibial edema.  No clubbing or  cyanosis Sensation intact to light touch in lower extremities.  Right Knee: Overall joint is well aligned, no significant deformity.   No significant effusion.   ROM: 0 to 120.   Extensor mechanism intact No significant medial or lateral joint line tenderness.   Stable to varus/valgus strain & anterior/posterior drawer.     Negative McMurray's and Thessaly      No results found. ASSESSMENT & PLAN:   Problem List Items Addressed This Visit    Pain and swelling of knee, right - Primary    Other Visit Diagnoses    Acute pain of right knee       [redacted] weeks gestation of pregnancy          Follow-up: Return if symptoms worsen or fail to improve.   CMA/ATC served as Neurosurgeonscribe during this visit. History, Physical, and Plan performed by medical provider. Documentation and orders reviewed and attested to.      Gaspar BiddingMichael Rigby, DO     Sports Medicine Physician    01/06/2017 8:50 AM

## 2017-01-06 NOTE — Patient Instructions (Signed)
Congratulations on your pregnancy.  Keep up with your exercises to help continue strengthening her knee.  If he had any issues am happy to see her back at any time.

## 2017-03-31 ENCOUNTER — Encounter: Payer: Self-pay | Admitting: Family Medicine

## 2017-06-30 ENCOUNTER — Inpatient Hospital Stay (HOSPITAL_COMMUNITY): Admission: AD | Admit: 2017-06-30 | Admitting: Obstetrics and Gynecology

## 2017-07-23 LAB — OB RESULTS CONSOLE GBS: STREP GROUP B AG: NEGATIVE

## 2017-08-14 ENCOUNTER — Inpatient Hospital Stay (HOSPITAL_COMMUNITY)
Admission: AD | Admit: 2017-08-14 | Discharge: 2017-08-15 | Disposition: A | Discharge status: Home / Self Care | Source: Ambulatory Visit | Attending: Obstetrics and Gynecology | Admitting: Obstetrics and Gynecology

## 2017-08-14 ENCOUNTER — Other Ambulatory Visit: Payer: Self-pay

## 2017-08-14 ENCOUNTER — Encounter (HOSPITAL_COMMUNITY): Payer: Self-pay | Admitting: *Deleted

## 2017-08-14 DIAGNOSIS — O479 False labor, unspecified: Secondary | ICD-10-CM | POA: Insufficient documentation

## 2017-08-14 DIAGNOSIS — Z3A00 Weeks of gestation of pregnancy not specified: Secondary | ICD-10-CM | POA: Insufficient documentation

## 2017-08-14 NOTE — Progress Notes (Signed)
Pt to walk for  1 hour and be rechecked

## 2017-08-14 NOTE — MAU Note (Signed)
Contractions all day. Have gotten closer and stronger. Membranes stripped THurs and was 2.5cm/60%. Some brown d/c today but no LOF

## 2017-08-14 NOTE — MAU Note (Signed)
Pt reports uc's q 8-9 minutes along with back pain. + Brown discharge. + Fm

## 2017-08-15 DIAGNOSIS — O479 False labor, unspecified: Secondary | ICD-10-CM | POA: Diagnosis not present

## 2017-08-15 DIAGNOSIS — Z3A00 Weeks of gestation of pregnancy not specified: Secondary | ICD-10-CM | POA: Diagnosis not present

## 2017-08-15 NOTE — MAU Note (Signed)
I have communicated with L. Clemmons CNM and reviewed vital signs:  Vitals:   08/14/17 2203 08/15/17 0150  BP: 138/78 128/77  Pulse: 80 72  Resp: 20 18  Temp: 98.1 F (36.7 C)     Vaginal exam:  Dilation: 2.5 Effacement (%): 80 Cervical Position: Middle Station: -3 Presentation: Vertex Exam by:: B Shereese Bonnie,   Also reviewed contraction pattern and that non-stress test is reactive.  It has been documented that patient is contracting every 3-11 minutes with no cervical change over 3.5 hours not indicating active labor.  Patient denies any other complaints.  Based on this report provider has given order for discharge.  A discharge order and diagnosis entered by a provider.   Labor discharge instructions reviewed with patient.

## 2017-08-15 NOTE — Discharge Instructions (Signed)
Braxton Hicks Contractions °Contractions of the uterus can occur throughout pregnancy, but they are not always a sign that you are in labor. You may have practice contractions called Braxton Hicks contractions. These false labor contractions are sometimes confused with true labor. °What are Braxton Hicks contractions? °Braxton Hicks contractions are tightening movements that occur in the muscles of the uterus before labor. Unlike true labor contractions, these contractions do not result in opening (dilation) and thinning of the cervix. Toward the end of pregnancy (32-34 weeks), Braxton Hicks contractions can happen more often and may become stronger. These contractions are sometimes difficult to tell apart from true labor because they can be very uncomfortable. You should not feel embarrassed if you go to the hospital with false labor. °Sometimes, the only way to tell if you are in true labor is for your health care provider to look for changes in the cervix. The health care provider will do a physical exam and may monitor your contractions. If you are not in true labor, the exam should show that your cervix is not dilating and your water has not broken. °If there are other health problems associated with your pregnancy, it is completely safe for you to be sent home with false labor. You may continue to have Braxton Hicks contractions until you go into true labor. °How to tell the difference between true labor and false labor °True labor °· Contractions last 30-70 seconds. °· Contractions become very regular. °· Discomfort is usually felt in the top of the uterus, and it spreads to the lower abdomen and low back. °· Contractions do not go away with walking. °· Contractions usually become more intense and increase in frequency. °· The cervix dilates and gets thinner. °False labor °· Contractions are usually shorter and not as strong as true labor contractions. °· Contractions are usually irregular. °· Contractions  are often felt in the front of the lower abdomen and in the groin. °· Contractions may go away when you walk around or change positions while lying down. °· Contractions get weaker and are shorter-lasting as time goes on. °· The cervix usually does not dilate or become thin. °Follow these instructions at home: °· Take over-the-counter and prescription medicines only as told by your health care provider. °· Keep up with your usual exercises and follow other instructions from your health care provider. °· Eat and drink lightly if you think you are going into labor. °· If Braxton Hicks contractions are making you uncomfortable: °? Change your position from lying down or resting to walking, or change from walking to resting. °? Sit and rest in a tub of warm water. °? Drink enough fluid to keep your urine pale yellow. Dehydration may cause these contractions. °? Do slow and deep breathing several times an hour. °· Keep all follow-up prenatal visits as told by your health care provider. This is important. °Contact a health care provider if: °· You have a fever. °· You have continuous pain in your abdomen. °Get help right away if: °· Your contractions become stronger, more regular, and closer together. °· You have fluid leaking or gushing from your vagina. °· You pass blood-tinged mucus (bloody show). °· You have bleeding from your vagina. °· You have low back pain that you never had before. °· You feel your baby’s head pushing down and causing pelvic pressure. °· Your baby is not moving inside you as much as it used to. °Summary °· Contractions that occur before labor are called Braxton   Hicks contractions, false labor, or practice contractions. °· Braxton Hicks contractions are usually shorter, weaker, farther apart, and less regular than true labor contractions. True labor contractions usually become progressively stronger and regular and they become more frequent. °· Manage discomfort from Braxton Hicks contractions by  changing position, resting in a warm bath, drinking plenty of water, or practicing deep breathing. °This information is not intended to replace advice given to you by your health care provider. Make sure you discuss any questions you have with your health care provider. °Document Released: 12/24/2016 Document Revised: 12/24/2016 Document Reviewed: 12/24/2016 °Elsevier Interactive Patient Education © 2018 [employer] Inc[employer] ° °

## 2017-08-15 NOTE — Progress Notes (Signed)
L Clemmons CNM made aware of Fetal Heart Tracing. Pt to be observed for one hour, rechecked,and pt may be discharged home with reactive tracing and no cervical change.

## 2017-08-19 ENCOUNTER — Other Ambulatory Visit: Payer: Self-pay | Admitting: Obstetrics & Gynecology

## 2017-08-20 ENCOUNTER — Telehealth (HOSPITAL_COMMUNITY): Payer: Self-pay | Admitting: *Deleted

## 2017-08-20 NOTE — Telephone Encounter (Signed)
Preadmission screen  

## 2017-08-22 ENCOUNTER — Encounter (HOSPITAL_COMMUNITY): Payer: Self-pay | Admitting: *Deleted

## 2017-08-22 ENCOUNTER — Inpatient Hospital Stay (HOSPITAL_COMMUNITY): Admitting: Anesthesiology

## 2017-08-22 ENCOUNTER — Inpatient Hospital Stay (HOSPITAL_COMMUNITY)
Admission: AD | Admit: 2017-08-22 | Discharge: 2017-08-23 | DRG: 807 | Disposition: A | Discharge status: Home / Self Care | Source: Ambulatory Visit | Attending: Obstetrics and Gynecology | Admitting: Obstetrics and Gynecology

## 2017-08-22 DIAGNOSIS — Z3A41 41 weeks gestation of pregnancy: Secondary | ICD-10-CM

## 2017-08-22 DIAGNOSIS — O480 Post-term pregnancy: Secondary | ICD-10-CM | POA: Diagnosis present

## 2017-08-22 LAB — CBC
HEMATOCRIT: 40.1 % (ref 36.0–46.0)
Hemoglobin: 13.5 g/dL (ref 12.0–15.0)
MCH: 27.4 pg (ref 26.0–34.0)
MCHC: 33.7 g/dL (ref 30.0–36.0)
MCV: 81.5 fL (ref 78.0–100.0)
PLATELETS: 229 10*3/uL (ref 150–400)
RBC: 4.92 MIL/uL (ref 3.87–5.11)
RDW: 15.5 % (ref 11.5–15.5)
WBC: 9.6 10*3/uL (ref 4.0–10.5)

## 2017-08-22 LAB — TYPE AND SCREEN
ABO/RH(D): B POS
Antibody Screen: NEGATIVE

## 2017-08-22 LAB — RPR: RPR Ser Ql: NONREACTIVE

## 2017-08-22 MED ORDER — ACETAMINOPHEN 325 MG PO TABS: 650.0000 mg | ORAL_TABLET | ORAL | Status: DC | PRN

## 2017-08-22 MED ORDER — LACTATED RINGERS IV SOLN: 500.0000 mL | Freq: Once | INTRAVENOUS | Status: DC

## 2017-08-22 MED ORDER — ONDANSETRON HCL 4 MG/2ML IJ SOLN: 4.0000 mg | INTRAMUSCULAR | Status: DC | PRN

## 2017-08-22 MED ORDER — DIBUCAINE 1 % RE OINT: 1.0000 "application " | TOPICAL_OINTMENT | RECTAL | Status: DC | PRN

## 2017-08-22 MED ORDER — ALBUTEROL SULFATE (2.5 MG/3ML) 0.083% IN NEBU: 2.5000 mg | INHALATION_SOLUTION | Freq: Four times a day (QID) | RESPIRATORY_TRACT | Status: DC | PRN

## 2017-08-22 MED ORDER — OXYCODONE-ACETAMINOPHEN 5-325 MG PO TABS: 1.0000 | ORAL_TABLET | ORAL | Status: DC | PRN

## 2017-08-22 MED ORDER — BENZOCAINE-MENTHOL 20-0.5 % EX AERO
1.0000 "application " | INHALATION_SPRAY | CUTANEOUS | Status: DC | PRN
  Administered 2017-08-23: 1 via TOPICAL
  Filled 2017-08-22: qty 56

## 2017-08-22 MED ORDER — FENTANYL 2.5 MCG/ML BUPIVACAINE 1/10 % EPIDURAL INFUSION (WH - ANES)
INTRAMUSCULAR | Status: AC
  Filled 2017-08-22: qty 100

## 2017-08-22 MED ORDER — ZOLPIDEM TARTRATE 5 MG PO TABS: 5.0000 mg | ORAL_TABLET | Freq: Every evening | ORAL | Status: DC | PRN

## 2017-08-22 MED ORDER — PHENYLEPHRINE 40 MCG/ML (10ML) SYRINGE FOR IV PUSH (FOR BLOOD PRESSURE SUPPORT)
80.0000 ug | PREFILLED_SYRINGE | INTRAVENOUS | Status: DC | PRN
  Filled 2017-08-22: qty 5

## 2017-08-22 MED ORDER — COCONUT OIL OIL
1.0000 "application " | TOPICAL_OIL | Status: DC | PRN
  Administered 2017-08-23: 1 via TOPICAL
  Filled 2017-08-22: qty 120

## 2017-08-22 MED ORDER — EPHEDRINE 5 MG/ML INJ
10.0000 mg | INTRAVENOUS | Status: DC | PRN
  Filled 2017-08-22: qty 2

## 2017-08-22 MED ORDER — ONDANSETRON HCL 4 MG/2ML IJ SOLN: 4.0000 mg | Freq: Four times a day (QID) | INTRAMUSCULAR | Status: DC | PRN

## 2017-08-22 MED ORDER — LIDOCAINE HCL (PF) 1 % IJ SOLN
INTRAMUSCULAR | Status: DC | PRN
  Administered 2017-08-22: 2 mL via EPIDURAL
  Administered 2017-08-22: 3 mL via EPIDURAL
  Administered 2017-08-22: 5 mL via EPIDURAL

## 2017-08-22 MED ORDER — SENNOSIDES-DOCUSATE SODIUM 8.6-50 MG PO TABS
2.0000 | ORAL_TABLET | ORAL | Status: DC
  Administered 2017-08-23: 2 via ORAL
  Filled 2017-08-22: qty 2

## 2017-08-22 MED ORDER — DIPHENHYDRAMINE HCL 25 MG PO CAPS: 25.0000 mg | ORAL_CAPSULE | Freq: Four times a day (QID) | ORAL | Status: DC | PRN

## 2017-08-22 MED ORDER — OXYTOCIN BOLUS FROM INFUSION
500.0000 mL | Freq: Once | INTRAVENOUS | Status: AC
  Administered 2017-08-22: 500 mL via INTRAVENOUS

## 2017-08-22 MED ORDER — LACTATED RINGERS IV SOLN
INTRAVENOUS | Status: DC
  Administered 2017-08-22: 06:00:00 via INTRAVENOUS

## 2017-08-22 MED ORDER — LACTATED RINGERS IV SOLN: 500.0000 mL | INTRAVENOUS | Status: DC | PRN

## 2017-08-22 MED ORDER — PRENATAL MULTIVITAMIN CH: 1.0000 | ORAL_TABLET | Freq: Every day | ORAL | Status: DC

## 2017-08-22 MED ORDER — LACTATED RINGERS IV SOLN
500.0000 mL | Freq: Once | INTRAVENOUS | Status: AC
  Administered 2017-08-22: 500 mL via INTRAVENOUS

## 2017-08-22 MED ORDER — IBUPROFEN 600 MG PO TABS
600.0000 mg | ORAL_TABLET | Freq: Four times a day (QID) | ORAL | Status: DC
  Administered 2017-08-22 – 2017-08-23 (×4): 600 mg via ORAL
  Filled 2017-08-22 (×4): qty 1

## 2017-08-22 MED ORDER — OXYCODONE-ACETAMINOPHEN 5-325 MG PO TABS: 2.0000 | ORAL_TABLET | ORAL | Status: DC | PRN

## 2017-08-22 MED ORDER — OXYTOCIN 40 UNITS IN LACTATED RINGERS INFUSION - SIMPLE MED
2.5000 [IU]/h | INTRAVENOUS | Status: DC
  Filled 2017-08-22: qty 1000

## 2017-08-22 MED ORDER — TETANUS-DIPHTH-ACELL PERTUSSIS 5-2.5-18.5 LF-MCG/0.5 IM SUSP: 0.5000 mL | Freq: Once | INTRAMUSCULAR | Status: DC

## 2017-08-22 MED ORDER — SOD CITRATE-CITRIC ACID 500-334 MG/5ML PO SOLN: 30.0000 mL | ORAL | Status: DC | PRN

## 2017-08-22 MED ORDER — LIDOCAINE HCL (PF) 1 % IJ SOLN
30.0000 mL | INTRAMUSCULAR | Status: DC | PRN
  Filled 2017-08-22: qty 30

## 2017-08-22 MED ORDER — WITCH HAZEL-GLYCERIN EX PADS: 1.0000 "application " | MEDICATED_PAD | CUTANEOUS | Status: DC | PRN

## 2017-08-22 MED ORDER — SIMETHICONE 80 MG PO CHEW: 80.0000 mg | CHEWABLE_TABLET | ORAL | Status: DC | PRN

## 2017-08-22 MED ORDER — ONDANSETRON HCL 4 MG PO TABS: 4.0000 mg | ORAL_TABLET | ORAL | Status: DC | PRN

## 2017-08-22 MED ORDER — DIPHENHYDRAMINE HCL 50 MG/ML IJ SOLN: 12.5000 mg | INTRAMUSCULAR | Status: DC | PRN

## 2017-08-22 MED ORDER — FENTANYL 2.5 MCG/ML BUPIVACAINE 1/10 % EPIDURAL INFUSION (WH - ANES)
14.0000 mL/h | INTRAMUSCULAR | Status: DC | PRN
  Administered 2017-08-22: 14 mL/h via EPIDURAL

## 2017-08-22 MED ORDER — ACETAMINOPHEN 325 MG PO TABS
650.0000 mg | ORAL_TABLET | ORAL | Status: DC | PRN
  Administered 2017-08-22 – 2017-08-23 (×2): 650 mg via ORAL
  Filled 2017-08-22 (×2): qty 2

## 2017-08-22 MED ORDER — FLEET ENEMA 7-19 GM/118ML RE ENEM: 1.0000 | ENEMA | RECTAL | Status: DC | PRN

## 2017-08-22 MED ORDER — PHENYLEPHRINE 40 MCG/ML (10ML) SYRINGE FOR IV PUSH (FOR BLOOD PRESSURE SUPPORT)
PREFILLED_SYRINGE | INTRAVENOUS | Status: AC
  Filled 2017-08-22: qty 10

## 2017-08-22 NOTE — Anesthesia Preprocedure Evaluation (Signed)
Anesthesia Evaluation  Patient identified by MRN, date of birth, ID band Patient awake    Reviewed: Allergy & Precautions, NPO status , Patient's Chart, lab work & pertinent test results  Airway Mallampati: II  TM Distance: >3 FB Neck ROM: Full    Dental  (+) Teeth Intact, Dental Advisory Given   Pulmonary asthma ,    Pulmonary exam normal breath sounds clear to auscultation       Cardiovascular negative cardio ROS Normal cardiovascular exam Rhythm:Regular Rate:Normal     Neuro/Psych  Headaches, Seizures -,  negative psych ROS   GI/Hepatic Neg liver ROS, GERD  Medicated,  Endo/Other  negative endocrine ROS  Renal/GU negative Renal ROS     Musculoskeletal negative musculoskeletal ROS (+)   Abdominal   Peds  Hematology negative hematology ROS (+) Plt 229k   Anesthesia Other Findings Day of surgery medications reviewed with the patient.  Reproductive/Obstetrics                             Anesthesia Physical Anesthesia Plan  ASA: II  Anesthesia Plan: Epidural   Post-op Pain Management:    Induction:   PONV Risk Score and Plan: 2 and Treatment may vary due to age or medical condition  Airway Management Planned:   Additional Equipment:   Intra-op Plan:   Post-operative Plan:   Informed Consent: I have reviewed the patients History and Physical, chart, labs and discussed the procedure including the risks, benefits and alternatives for the proposed anesthesia with the patient or authorized representative who has indicated his/her understanding and acceptance.   Dental advisory given  Plan Discussed with:   Anesthesia Plan Comments: (Patient identified. Risks/Benefits/Options discussed with patient including but not limited to bleeding, infection, nerve damage, paralysis, failed block, incomplete pain control, headache, blood pressure changes, nausea, vomiting, reactions to  medication both or allergic, itching and postpartum back pain. Confirmed with bedside nurse the patient's most recent platelet count. Confirmed with patient that they are not currently taking any anticoagulation, have any bleeding history or any family history of bleeding disorders. Patient expressed understanding and wished to proceed. All questions were answered. )        Anesthesia Quick Evaluation

## 2017-08-22 NOTE — Anesthesia Procedure Notes (Signed)
Epidural Patient location during procedure: OB Start time: 08/22/2017 6:41 AM End time: 08/22/2017 6:46 AM  Staffing Anesthesiologist: Cecile Hearingurk, Stephen Edward, MD Performed: anesthesiologist   Preanesthetic Checklist Completed: patient identified, pre-op evaluation, timeout performed, IV checked, risks and benefits discussed and monitors and equipment checked  Epidural Patient position: sitting Prep: DuraPrep Patient monitoring: blood pressure and continuous pulse ox Approach: midline Location: L3-L4 Injection technique: LOR air  Needle:  Needle type: Tuohy  Needle gauge: 17 G Needle length: 9 cm Needle insertion depth: 7 cm Catheter size: 19 Gauge Catheter at skin depth: 12 cm Test dose: negative and Other (1% Lidocaine)  Additional Notes Patient identified.  Risk benefits discussed including failed block, incomplete pain control, headache, nerve damage, paralysis, blood pressure changes, nausea, vomiting, reactions to medication both toxic or allergic, and postpartum back pain.  Patient expressed understanding and wished to proceed.  All questions were answered.  Sterile technique used throughout procedure and epidural site dressed with sterile barrier dressing. No paresthesia or other complications noted. The patient did not experience any signs of intravascular injection such as tinnitus or metallic taste in mouth nor signs of intrathecal spread such as rapid motor block. Please see nursing notes for vital signs. Reason for block:procedure for pain

## 2017-08-22 NOTE — Progress Notes (Signed)
To BS via w/c °

## 2017-08-22 NOTE — H&P (Signed)
Sylvia Townsend is a 36 y.o. female presenting for labor OB History    Gravida Para Term Preterm AB Living   2 1 1  0 0 1   SAB TAB Ectopic Multiple Live Births   0 0 0 0 1     Past Medical History:  Diagnosis Date  . Abnormal Pap smear 2004,2005   Hx of.  . Acid reflux   . Asthma    Induced w/ weather changes;inhaler prn  . Migraines 2012  . Seizure ([facility]CC)    x 2 as a Toddler   Past Surgical History:  Procedure Laterality Date  . WISDOM TOOTH EXTRACTION  2007   all 4 removed   Family History: family history includes Asthma in her cousin, other, and sister; Cancer in her paternal grandmother and paternal uncle; Diverticulitis in her mother; Hypertension in her maternal grandfather; Kidney failure in her maternal uncle; Lupus in her sister; Migraines in her father; Stroke in her maternal grandfather and maternal grandmother. Social History:  reports that  has never smoked. she has never used smokeless tobacco. She reports that she drinks about 1.0 oz of alcohol per week. She reports that she does not use drugs.     Maternal Diabetes: No Genetic Screening: Normal Maternal Ultrasounds/Referrals: Normal Fetal Ultrasounds or other Referrals:  None Maternal Substance Abuse:  No Significant Maternal Medications:  None Significant Maternal Lab Results:  None Other Comments:  None  Review of Systems  Gastrointestinal: Positive for abdominal pain.  All other systems reviewed and are negative.  Maternal Medical History:  Reason for admission: Contractions.  Pt presents to [facility] with regular contractions since 2am. Very uncomfortable; desires epidural. Non eventful pregnancy course, Gbs negative. Parents do not know the sex of the baby. Proven Pelvis to 7lbs 13 oz; history of 3rd degree laceration.  Contractions: Onset was 3-5 hours ago.   Frequency: regular.   Perceived severity is strong.    Fetal activity: Perceived fetal activity is normal.   Last perceived fetal movement  was within the past hour.    Prenatal complications: no prenatal complications   Dilation: 6 Effacement (%): 70 Station: -2 Exam by:: Lorn Junes. Goodman, RN Last menstrual period 11/11/2016, unknown if currently breastfeeding. Maternal Exam:  Uterine Assessment: Contraction strength is moderate.  Contraction frequency is regular.   Abdomen: Patient reports no abdominal tenderness. Estimated fetal weight is 8 .   Fetal presentation: vertex  Introitus: Normal vulva. Normal vagina.  Pelvis: adequate for delivery.   Cervix: Cervix evaluated by digital exam.     Fetal Exam Fetal Monitor Review: Mode: ultrasound.   Baseline rate: 130.  Variability: moderate (6-25 bpm).   Pattern: accelerations present.    Fetal State Assessment: Category I - tracings are normal.     Physical Exam  Nursing note and vitals reviewed. Constitutional: She is oriented to person, place, and time. She appears well-developed and well-nourished.  HENT:  Head: Normocephalic and atraumatic.  Neck: Normal range of motion.  Cardiovascular: Normal rate.  Respiratory: Effort normal.  GI: Soft.  Genitourinary: Vagina normal.  Musculoskeletal: Normal range of motion.  Neurological: She is alert and oriented to person, place, and time.  Skin: Skin is warm and dry.  Psychiatric: She has a normal mood and affect. Her behavior is normal. Judgment and thought content normal.    Prenatal labs: ABO, Rh: B/Positive/-- (04/27 0000) Antibody: Negative (04/27 0000) Rubella: Immune (04/27 0000) RPR: Nonreactive (04/27 0000)  HBsAg: Negative (04/27 0000)  HIV: Non-reactive (04/27  0000)  GBS: Negative (11/30 0000)   Assessment/Plan: Admit to L/D Postdates @ 41+2 GBs Negative Routine L/D orders  Epidural Anticipate vaginal delivery  Lori A Clemmons CNM 08/22/2017, 6:39 AM

## 2017-08-22 NOTE — Anesthesia Postprocedure Evaluation (Signed)
Anesthesia Post Note  Patient: Sylvia Townsend  Procedure(s) Performed: AN AD HOC LABOR EPIDURAL     Patient location during evaluation: Mother Baby Anesthesia Type: Epidural Level of consciousness: awake Pain management: satisfactory to patient Vital Signs Assessment: post-procedure vital signs reviewed and stable Respiratory status: spontaneous breathing Cardiovascular status: stable Anesthetic complications: no    Last Vitals:  Vitals:   08/22/17 1200 08/22/17 1300  BP: 121/72 123/66  Pulse: 74 82  Resp: 18 18  Temp: 36.9 C 37 C  SpO2:      Last Pain:  Vitals:   08/22/17 1300  TempSrc: Axillary  PainSc: 0-No pain   Pain Goal:                 KeyCorpBURGER,Reznor Ferrando

## 2017-08-23 LAB — CBC
HCT: 34.3 % — ABNORMAL LOW (ref 36.0–46.0)
Hemoglobin: 11.4 g/dL — ABNORMAL LOW (ref 12.0–15.0)
MCH: 27 pg (ref 26.0–34.0)
MCHC: 33.2 g/dL (ref 30.0–36.0)
MCV: 81.3 fL (ref 78.0–100.0)
PLATELETS: 207 10*3/uL (ref 150–400)
RBC: 4.22 MIL/uL (ref 3.87–5.11)
RDW: 15.4 % (ref 11.5–15.5)
WBC: 12 10*3/uL — ABNORMAL HIGH (ref 4.0–10.5)

## 2017-08-23 MED ORDER — IBUPROFEN 600 MG PO TABS: 600.0000 mg | ORAL_TABLET | Freq: Four times a day (QID) | ORAL | 0 refills | Status: AC

## 2017-08-23 NOTE — Lactation Note (Signed)
This note was copied from a baby's chart. Lactation Consultation Note  Patient Name: Sylvia Townsend ZOXWR'UToday's Date: 08/23/2017 Reason for consult: Other (Comment)(post term )  Baby is 7326 hours old  LC reviewed and updated the doc flow sheets per mom  As LC baby latched in a awkward position, LC recommended mom release latch  And re-latch. Mom receptive, and LC assisted mom to re-latch with laud back and depth  Achieved, swallows noted and increased with breast compressions.  Per mom the re-latch position is a lot more comfortable. Baby fed for 23 mins, and released  On her own. LC discussed nutritive vs non -nutritive feeding patterns, and to watch for hanging out latched.  Also discussed different positions.  LC  Reviewed hand expressing, the importance of STS feedings until the baby can stay awake for feeding.  Per mom nipples are a little sore. Sore nipple and engorgement prevention and tx reviewed.  LC instructed on the use shells between feedings except when sleeping, and hand pump.  Per mom has a DEBP Medela at home.  LC recommended and encouraged  Mom to take advantage of the [facility] and [facility] services if having  Breast feeding challenges like she explained she had with her 1st baby.  Mother informed of post-discharge support and given phone number to the lactation department, including services for phone call assistance; out-patient appointments; and breastfeeding support group. List of other breastfeeding resources in the community given in the handout. Encouraged mother to call for problems or concerns related to breastfeeding.   Maternal Data Has patient been taught Hand Expression?: Yes Does the patient have breastfeeding experience prior to this delivery?: Yes  Feeding Feeding Type: Breast Fed Length of feed: (still feeding at 12 mins )  LATCH Score Latch: Grasps breast easily, tongue down, lips flanged, rhythmical sucking.  Audible Swallowing: Spontaneous and  intermittent  Type of Nipple: Everted at rest and after stimulation  Comfort (Breast/Nipple): Soft / non-tender  Hold (Positioning): Assistance needed to correctly position infant at breast and maintain latch.  LATCH Score: 9  Interventions Interventions: Breast feeding basics reviewed;Assisted with latch;Skin to skin;Breast massage;Breast compression;Hand pump  Lactation Tools Discussed/Used Tools: Pump Breast pump type: Manual WIC Program: No Pump Review: Setup, frequency, and cleaning;Milk Storage Initiated by:: MAI  Date initiated:: 08/23/17   Consult Status Consult Status: Complete    Matilde SprangMargaret Ann Bowen Goyal 08/23/2017, 10:09 AM

## 2017-08-23 NOTE — Discharge Summary (Signed)
Obstetric Discharge Summary Reason for Admission: onset of labor Prenatal Procedures: none Intrapartum Procedures: spontaneous vaginal delivery Postpartum Procedures: none Complications-Operative and Postpartum: first degree perineal laceration Hemoglobin  Date Value Ref Range Status  08/22/2017 13.5 12.0 - 15.0 g/dL Final   HCT  Date Value Ref Range Status  08/22/2017 40.1 36.0 - 46.0 % Final    Physical Exam:  General: alert, cooperative and appears stated age 59Lochia: appropriate Uterine Fundus: firm Incision:  DVT Evaluation: No evidence of DVT seen on physical exam.  Discharge Diagnoses: Term Pregnancy-delivered  Discharge Information: Date: 08/23/2017 Activity: pelvic rest Diet: routine Medications: PNV, Ibuprofen and Iron Condition: stable Instructions: refer to practice specific booklet Discharge to: home Follow-up Information    [facility]. Schedule an appointment as soon as possible for a visit in 6 week(s).   Specialty:  Obstetrics and Gynecology Contact information: [address]  [place] [phone]          Newborn Data: Live born female  Birth Weight: 7 lb 2.1 oz (3235 g) APGAR: 9, 9  Newborn Delivery   Birth date/time:  08/22/2017 08:08:00 Delivery type:  Vaginal, Spontaneous     Home with mother.  Lori A Clemmons CNM 08/23/2017, 3:46 AM

## 2017-08-23 NOTE — Discharge Instructions (Signed)
Vaginal Delivery, Care After °Refer to this sheet in the next few weeks. These instructions provide you with information about caring for yourself after vaginal delivery. Your health care provider may also give you more specific instructions. Your treatment has been planned according to current medical practices, but problems sometimes occur. Call your health care provider if you have any problems or questions. °What can I expect after the procedure? °After vaginal delivery, it is common to have: °· Some bleeding from your vagina. °· Soreness in your abdomen, your vagina, and the area of skin between your vaginal opening and your anus (perineum). °· Pelvic cramps. °· Fatigue. ° °Follow these instructions at home: °Medicines °· Take over-the-counter and prescription medicines only as told by your health care provider. °· If you were prescribed an antibiotic medicine, take it as told by your health care provider. Do not stop taking the antibiotic until it is finished. °Driving ° °· Do not drive or operate heavy machinery while taking prescription pain medicine. °· Do not drive for 24 hours if you received a sedative. °Lifestyle °· Do not drink alcohol. This is especially important if you are breastfeeding or taking medicine to relieve pain. °· Do not use tobacco products, including cigarettes, chewing tobacco, or e-cigarettes. If you need help quitting, ask your health care provider. °Eating and drinking °· Drink at least 8 eight-ounce glasses of water every day unless you are told not to by your health care provider. If you choose to breastfeed your baby, you may need to drink more water than this. °· Eat high-fiber foods every day. These foods may help prevent or relieve constipation. High-fiber foods include: °? Whole grain cereals and breads. °? Brown rice. °? Beans. °? Fresh fruits and vegetables. °Activity °· Return to your normal activities as told by your health care provider. Ask your health care provider  what activities are safe for you. °· Rest as much as possible. Try to rest or take a nap when your baby is sleeping. °· Do not lift anything that is heavier than your baby or 10 lb (4.5 kg) until your health care provider says that it is safe. °· Talk with your health care provider about when you can engage in sexual activity. This may depend on your: °? Risk of infection. °? Rate of healing. °? Comfort and desire to engage in sexual activity. °Vaginal Care °· If you have an episiotomy or a vaginal tear, check the area every day for signs of infection. Check for: °? More redness, swelling, or pain. °? More fluid or blood. °? Warmth. °? Pus or a bad smell. °· Do not use tampons or douches until your health care provider says this is safe. °· Watch for any blood clots that may pass from your vagina. These may look like clumps of dark red, brown, or black discharge. °General instructions °· Keep your perineum clean and dry as told by your health care provider. °· Wear loose, comfortable clothing. °· Wipe from front to back when you use the toilet. °· Ask your health care provider if you can shower or take a bath. If you had an episiotomy or a perineal tear during labor and delivery, your health care provider may tell you not to take baths for a certain length of time. °· Wear a bra that supports your breasts and fits you well. °· If possible, have someone help you with household activities and help care for your baby for at least a few days after   you leave the hospital. °· Keep all follow-up visits for you and your baby as told by your health care provider. This is important. °Contact a health care provider if: °· You have: °? Vaginal discharge that has a bad smell. °? Difficulty urinating. °? Pain when urinating. °? A sudden increase or decrease in the frequency of your bowel movements. °? More redness, swelling, or pain around your episiotomy or vaginal tear. °? More fluid or blood coming from your episiotomy or  vaginal tear. °? Pus or a bad smell coming from your episiotomy or vaginal tear. °? A fever. °? A rash. °? Little or no interest in activities you used to enjoy. °? Questions about caring for yourself or your baby. °· Your episiotomy or vaginal tear feels warm to the touch. °· Your episiotomy or vaginal tear is separating or does not appear to be healing. °· Your breasts are painful, hard, or turn red. °· You feel unusually sad or worried. °· You feel nauseous or you vomit. °· You pass large blood clots from your vagina. If you pass a blood clot from your vagina, save it to show to your health care provider. Do not flush blood clots down the toilet without having your health care provider look at them. °· You urinate more than usual. °· You are dizzy or light-headed. °· You have not breastfed at all and you have not had a menstrual period for 12 weeks after delivery. °· You have stopped breastfeeding and you have not had a menstrual period for 12 weeks after you stopped breastfeeding. °Get help right away if: °· You have: °? Pain that does not go away or does not get better with medicine. °? Chest pain. °? Difficulty breathing. °? Blurred vision or spots in your vision. °? Thoughts about hurting yourself or your baby. °· You develop pain in your abdomen or in one of your legs. °· You develop a severe headache. °· You faint. °· You bleed from your vagina so much that you fill two sanitary pads in one hour. °This information is not intended to replace advice given to you by your health care provider. Make sure you discuss any questions you have with your health care provider. °Document Released: 08/07/2000 Document Revised: 01/22/2016 Document Reviewed: 08/25/2015 °Elsevier Interactive Patient Education © 2018 [employer] ° °

## 2017-08-25 ENCOUNTER — Inpatient Hospital Stay (HOSPITAL_COMMUNITY)

## 2017-09-13 ENCOUNTER — Encounter: Payer: Self-pay | Admitting: Obstetrics and Gynecology

## 2017-10-28 ENCOUNTER — Encounter: Admitting: Family Medicine

## 2017-10-28 ENCOUNTER — Encounter: Admitting: Family

## 2017-11-26 ENCOUNTER — Ambulatory Visit (INDEPENDENT_AMBULATORY_CARE_PROVIDER_SITE_OTHER): Admitting: Internal Medicine

## 2017-11-26 ENCOUNTER — Encounter: Payer: Self-pay | Admitting: Internal Medicine

## 2017-11-26 VITALS — BP 130/80 | HR 91 | Temp 98.3°F | Ht 63.0 in | Wt 215.2 lb

## 2017-11-26 DIAGNOSIS — Z13220 Encounter for screening for lipoid disorders: Secondary | ICD-10-CM

## 2017-11-26 DIAGNOSIS — E559 Vitamin D deficiency, unspecified: Secondary | ICD-10-CM | POA: Insufficient documentation

## 2017-11-26 DIAGNOSIS — N926 Irregular menstruation, unspecified: Secondary | ICD-10-CM | POA: Insufficient documentation

## 2017-11-26 DIAGNOSIS — Z0184 Encounter for antibody response examination: Secondary | ICD-10-CM | POA: Diagnosis not present

## 2017-11-26 DIAGNOSIS — Z1159 Encounter for screening for other viral diseases: Secondary | ICD-10-CM

## 2017-11-26 DIAGNOSIS — Z789 Other specified health status: Secondary | ICD-10-CM

## 2017-11-26 DIAGNOSIS — N912 Amenorrhea, unspecified: Secondary | ICD-10-CM | POA: Diagnosis not present

## 2017-11-26 DIAGNOSIS — IMO0001 Reserved for inherently not codable concepts without codable children: Secondary | ICD-10-CM

## 2017-11-26 LAB — POCT URINE PREGNANCY: PREG TEST UR: NEGATIVE

## 2017-11-26 NOTE — Patient Instructions (Signed)
Schedule fasting cholesterol check in the next 2 months nothing but water and medications  Schedule f/u appt with me in 3 months   Secondary Amenorrhea Secondary amenorrhea is the stopping of menstrual flow for 3-6 months in a female who has previously had periods. There are many possible causes. Most of these causes are not serious. Usually, treating the underlying problem causing the loss of menses will return your periods to normal. What are the causes? Some common and uncommon causes of not menstruating include:  Malnutrition.  Low blood sugar (hypoglycemia).  Polycystic ovary disease.  Stress or fear.  Breastfeeding.  Hormone imbalance.  Ovarian failure.  Medicines.  Extreme obesity.  Cystic fibrosis.  Low body weight or drastic weight reduction from any cause.  Early menopause.  Removal of ovaries or uterus.  Contraceptives.  Illness.  Long-term (chronic) illnesses.  Cushing syndrome.  Thyroid problems.  Birth control pills, patches, or vaginal rings for birth control.  What increases the risk? You may be at greater risk of secondary amenorrhea if:  You have a family history of this condition.  You have an eating disorder.  You do athletic training.  How is this diagnosed? A diagnosis is made by your health care provider taking a medical history and doing a physical exam. This will include a pelvic exam to check for problems with your reproductive organs. Pregnancy must be ruled out. Often, numerous blood tests are done to measure different hormones in the body. Urine testing may be done. Specialized exams (ultrasound, CT scan, MRI, or hysteroscopy) may have to be done as well as measuring the body mass index (BMI). How is this treated? Treatment depends on the cause of the amenorrhea. If an eating disorder is present, this can be treated with an adequate diet and therapy. Chronic illnesses may improve with treatment of the illness. Amenorrhea may be  corrected with medicines, lifestyle changes, or surgery. If the amenorrhea cannot be corrected, it is sometimes possible to create a false menstruation with medicines. Follow these instructions at home:  Maintain a healthy diet.  Manage weight problems.  Exercise regularly but not excessively.  Get adequate sleep.  Manage stress.  Be aware of changes in your menstrual cycle. Keep a record of when your periods occur. Note the date your period starts, how long it lasts, and any problems. Contact a health care provider if: Your symptoms do not get better with treatment. This information is not intended to replace advice given to you by your health care provider. Make sure you discuss any questions you have with your health care provider. Document Released: 09/21/2006 Document Revised: 01/16/2016 Document Reviewed: 01/26/2013 Elsevier Interactive Patient Education  [employer]2018 Elsevier Inc.

## 2017-11-26 NOTE — Progress Notes (Signed)
Pre visit review using our clinic review tool, if applicable. No additional management support is needed unless otherwise documented below in the visit note. 

## 2017-11-27 LAB — URINALYSIS, ROUTINE W REFLEX MICROSCOPIC
Bacteria, UA: NONE SEEN /HPF
Bilirubin Urine: NEGATIVE
GLUCOSE, UA: NEGATIVE
HYALINE CAST: NONE SEEN /LPF
KETONES UR: NEGATIVE
LEUKOCYTES UA: NEGATIVE
Nitrite: NEGATIVE
PH: 6 (ref 5.0–8.0)
Protein, ur: NEGATIVE
SPECIFIC GRAVITY, URINE: 1.021 (ref 1.001–1.03)

## 2017-11-29 ENCOUNTER — Telehealth: Payer: Self-pay | Admitting: Internal Medicine

## 2017-11-29 LAB — COMPREHENSIVE METABOLIC PANEL
AG Ratio: 1.2 (calc) (ref 1.0–2.5)
ALKALINE PHOSPHATASE (APISO): 56 U/L (ref 33–115)
ALT: 17 U/L (ref 6–29)
AST: 17 U/L (ref 10–30)
Albumin: 3.9 g/dL (ref 3.6–5.1)
BILIRUBIN TOTAL: 0.3 mg/dL (ref 0.2–1.2)
BUN: 8 mg/dL (ref 7–25)
CO2: 26 mmol/L (ref 20–32)
Calcium: 8.8 mg/dL (ref 8.6–10.2)
Chloride: 106 mmol/L (ref 98–110)
Creat: 0.91 mg/dL (ref 0.50–1.10)
Globulin: 3.3 g/dL (calc) (ref 1.9–3.7)
Glucose, Bld: 90 mg/dL (ref 65–99)
Potassium: 4 mmol/L (ref 3.5–5.3)
SODIUM: 139 mmol/L (ref 135–146)
TOTAL PROTEIN: 7.2 g/dL (ref 6.1–8.1)

## 2017-11-29 LAB — CBC WITH DIFFERENTIAL/PLATELET
BASOS PCT: 1 %
Basophils Absolute: 81 cells/uL (ref 0–200)
Eosinophils Absolute: 203 cells/uL (ref 15–500)
Eosinophils Relative: 2.5 %
HEMATOCRIT: 35.6 % (ref 35.0–45.0)
HEMOGLOBIN: 12 g/dL (ref 11.7–15.5)
LYMPHS ABS: 2584 {cells}/uL (ref 850–3900)
MCH: 27 pg (ref 27.0–33.0)
MCHC: 33.7 g/dL (ref 32.0–36.0)
MCV: 80.2 fL (ref 80.0–100.0)
MPV: 11.1 fL (ref 7.5–12.5)
Monocytes Relative: 8.9 %
NEUTROS ABS: 4512 {cells}/uL (ref 1500–7800)
Neutrophils Relative %: 55.7 %
Platelets: 292 10*3/uL (ref 140–400)
RBC: 4.44 10*6/uL (ref 3.80–5.10)
RDW: 12.7 % (ref 11.0–15.0)
Total Lymphocyte: 31.9 %
WBC mixed population: 721 cells/uL (ref 200–950)
WBC: 8.1 10*3/uL (ref 3.8–10.8)

## 2017-11-29 LAB — HEPATITIS B SURFACE ANTIBODY, QUANTITATIVE: Hepatitis B-Post: 509 m[IU]/mL (ref >=10)

## 2017-11-29 LAB — ESTRADIOL

## 2017-11-29 LAB — VITAMIN D 25 HYDROXY (VIT D DEFICIENCY, FRACTURES): Vit D, 25-Hydroxy: 40 ng/mL (ref 30–100)

## 2017-11-29 LAB — FOLLICLE STIMULATING HORMONE: FSH: <0.7 m[IU]/mL — ABNORMAL LOW

## 2017-11-29 LAB — TESTOSTERONE, TOTAL, LC/MS/MS

## 2017-11-29 LAB — TSH: TSH: 0.83 m[IU]/L

## 2017-11-29 LAB — T4, FREE: Free T4: 1.1 ng/dL (ref 0.8–1.8)

## 2017-11-29 NOTE — Telephone Encounter (Signed)
Pt given results per notes of Dr McLean-Scocuzza on 11/29/17.Unable to document in result note due to [facility] being [facility]. Pt wanted Dr McLean-Scocuzza to know that after she submitted her U/A, 2 days later her period started.  She will call to schedule an appt with her OB-Gyn to make an appointment.    Notes recorded by McLean-Scocuzza, Pasty Spillersracy N, MD on 11/29/2017 at 7:25 AM EDT FSH/follicular stimulating hormone low  Estrogen level low  -please f/u OB/GYN   Liver kidneys normal  Blood cts normal  Thyroid and vit D normal  Urine with trace blood  Urine pregnancy negative  Hep b immune had the vaccine before and took to it   TMS

## 2017-11-29 NOTE — Telephone Encounter (Signed)
FYI

## 2017-11-30 ENCOUNTER — Encounter: Payer: Self-pay | Admitting: Internal Medicine

## 2017-11-30 NOTE — Progress Notes (Addendum)
Chief Complaint  Patient presents with  . Follow-up    transfer from Dr. Adriana Simas   Establish care former Dr. Adriana Simas pt primary care  1. Pt c/o 4 years ago when had son she went 8 months w/o cycle and had some light spotting, since the birth of 2nd child recently she has had 1 cycle in the last 3 months and it was regular but yesterday she was supposed to start cycle and did not. She is concerned she will have the same irregular cycles after 2nd pregnancy like the 1st. She stopped breast feeding as of now.   2. H/o vitamin D def in the past   Review of Systems  Constitutional: Negative for weight loss.  HENT: Negative for hearing loss.   Eyes: Negative for blurred vision.  Respiratory: Negative for shortness of breath.   Cardiovascular: Negative for chest pain.  Gastrointestinal: Negative for abdominal pain.  Genitourinary:       +abnormal cycles   Musculoskeletal: Negative for falls.  Skin: Negative for rash.  Neurological: Negative for headaches.  Psychiatric/Behavioral: Negative for depression.   Past Medical History:  Diagnosis Date  . Abnormal Pap smear 2004,2005   Hx of.  . Acid reflux   . Asthma    Induced w/ weather changes;inhaler prn  . Migraines 2012  . Seizure (HCC)    x 2 as a Toddler   Past Surgical History:  Procedure Laterality Date  . WISDOM TOOTH EXTRACTION  2007   all 4 removed   Family History  Problem Relation Age of Onset  . Stroke Maternal Grandfather   . Hypertension Maternal Grandfather   . Cancer Paternal Uncle        Throat  . Cancer Paternal Grandmother        Unsure which type  . Migraines Father   . Asthma Sister        x 2  . Asthma Cousin        Maternal  . Asthma Other        Nephew  . Kidney failure Maternal Uncle   . Lupus Sister   . Diverticulitis Mother   . Stroke Maternal Grandmother    Social History   Socioeconomic History  . Marital status: Married    Spouse name: Not on file  . Number of children: Not on file  .  Years of education: Not on file  . Highest education level: Not on file  Occupational History  . Occupation: CSR    Employer: [employer]    Comment: [employer]  Social Needs  . Financial resource strain: Not on file  . Food insecurity:    Worry: Not on file    Inability: Not on file  . Transportation needs:    Medical: Not on file    Non-medical: Not on file  Tobacco Use  . Smoking status: Never Smoker  . Smokeless tobacco: Never Used  Substance and Sexual Activity  . Alcohol use: Yes    Alcohol/week: 1.0 oz    Types: 2 Standard drinks or equivalent per week    Comment: socially   . Drug use: No  . Sexual activity: Yes    Partners: Male    Birth control/protection: Pill  Lifestyle  . Physical activity:    Days per week: Not on file    Minutes per session: Not on file  . Stress: Not on file  Relationships  . Social connections:    Talks on phone: Not on  file    Gets together: Not on file    Attends religious service: Not on file    Active member of club or organization: Not on file    Attends meetings of clubs or organizations: Not on file    Relationship status: Not on file  . Intimate partner violence:    Fear of current or ex partner: Not on file    Emotionally abused: Not on file    Physically abused: Not on file    Forced sexual activity: Not on file  Other Topics Concern  . Not on file  Social History Narrative   Married with children   Current Meds  Medication Sig  . acetaminophen (TYLENOL) 500 MG tablet Take 1,000 mg by mouth every 6 (six) hours as needed for moderate pain or headache.  . albuterol (PROVENTIL HFA;VENTOLIN HFA) 108 (90 BASE) MCG/ACT inhaler Inhale 2 puffs into the lungs every 6 (six) hours as needed for wheezing or shortness of breath. Rescue inhaler  . ibuprofen (ADVIL,MOTRIN) 600 MG tablet Take 1 tablet (600 mg total) by mouth every 6 (six) hours.  . Iron-FA-B Cmp-C-Biot-Probiotic (FUSION PLUS) CAPS Take 1 capsule by mouth  daily.  . Prenatal Multivit-Min-Fe-FA (PRENATAL VITAMINS) 0.8 MG tablet Take 1 tablet by mouth daily.   No Known Allergies Recent Results (from the past 2160 hour(s))  Comprehensive metabolic panel     Status: None   Collection Time: 11/26/17  4:34 PM  Result Value Ref Range   Glucose, Bld 90 65 - 99 mg/dL    Comment: .            Fasting reference interval .    BUN 8 7 - 25 mg/dL   Creat 1.61 0.96 - 0.45 mg/dL   BUN/Creatinine Ratio NOT APPLICABLE 6 - 22 (calc)   Sodium 139 135 - 146 mmol/L   Potassium 4.0 3.5 - 5.3 mmol/L   Chloride 106 98 - 110 mmol/L   CO2 26 20 - 32 mmol/L   Calcium 8.8 8.6 - 10.2 mg/dL   Total Protein 7.2 6.1 - 8.1 g/dL   Albumin 3.9 3.6 - 5.1 g/dL   Globulin 3.3 1.9 - 3.7 g/dL (calc)   AG Ratio 1.2 1.0 - 2.5 (calc)   Total Bilirubin 0.3 0.2 - 1.2 mg/dL   Alkaline phosphatase (APISO) 56 33 - 115 U/L   AST 17 10 - 30 U/L   ALT 17 6 - 29 U/L  CBC with Differential/Platelet     Status: None   Collection Time: 11/26/17  4:34 PM  Result Value Ref Range   WBC 8.1 3.8 - 10.8 Thousand/uL   RBC 4.44 3.80 - 5.10 Million/uL   Hemoglobin 12.0 11.7 - 15.5 g/dL   HCT 40.9 81.1 - 91.4 %   MCV 80.2 80.0 - 100.0 fL   MCH 27.0 27.0 - 33.0 pg   MCHC 33.7 32.0 - 36.0 g/dL   RDW 78.2 95.6 - 21.3 %   Platelets 292 140 - 400 Thousand/uL   MPV 11.1 7.5 - 12.5 fL   Neutro Abs 4,512 1,500 - 7,800 cells/uL   Lymphs Abs 2,584 850 - 3,900 cells/uL   WBC mixed population 721 200 - 950 cells/uL   Eosinophils Absolute 203 15 - 500 cells/uL   Basophils Absolute 81 0 - 200 cells/uL   Neutrophils Relative % 55.7 %   Total Lymphocyte 31.9 %   Monocytes Relative 8.9 %   Eosinophils Relative 2.5 %   Basophils Relative 1.0 %  TSH     Status: None   Collection Time: 11/26/17  4:34 PM  Result Value Ref Range   TSH 0.83 mIU/L    Comment:           Reference Range .           > or = 20 Years  0.40-4.50 .                Pregnancy Ranges           First trimester    0.26-2.66            Second trimester   0.55-2.73           Third trimester    0.43-2.91   T4, free     Status: None   Collection Time: 11/26/17  4:34 PM  Result Value Ref Range   Free T4 1.1 0.8 - 1.8 ng/dL  Urinalysis, Routine w reflex microscopic     Status: Abnormal   Collection Time: 11/26/17  4:34 PM  Result Value Ref Range   Color, Urine YELLOW YELLOW   APPearance CLEAR CLEAR   Specific Gravity, Urine 1.021 1.001 - 1.03   pH 6.0 5.0 - 8.0   Glucose, UA NEGATIVE NEGATIVE   Bilirubin Urine NEGATIVE NEGATIVE   Ketones, ur NEGATIVE NEGATIVE   Hgb urine dipstick TRACE (A) NEGATIVE   Protein, ur NEGATIVE NEGATIVE   Nitrite NEGATIVE NEGATIVE   Leukocytes, UA NEGATIVE NEGATIVE   WBC, UA 0-5 0 - 5 /HPF   RBC / HPF 0-2 0 - 2 /HPF   Squamous Epithelial / LPF 0-5 < OR = 5 /HPF   Bacteria, UA NONE SEEN NONE SEEN /HPF   Hyaline Cast NONE SEEN NONE SEEN /LPF  Vitamin D (25 hydroxy)     Status: None   Collection Time: 11/26/17  4:34 PM  Result Value Ref Range   Vit D, 25-Hydroxy 40 30 - 100 ng/mL    Comment: Vitamin D Status         25-OH Vitamin D: . Deficiency:                    <20 ng/mL Insufficiency:             20 - 29 ng/mL Optimal:                 > or = 30 ng/mL . For 25-OH Vitamin D testing on patients on  D2-supplementation and patients for whom quantitation  of D2 and D3 fractions is required, the QuestAssureD(TM) 25-OH VIT D, (D2,D3), LC/MS/MS is recommended: order  code [id number] (patients >3541yrs). . For more information on this test, go to: http://education.quest[facility]ostics.com/faq/FAQ163 (This link is being provided for  informational/educational purposes only.)   Hepatitis B surface antibody     Status: None   Collection Time: 11/26/17  4:34 PM  Result Value Ref Range   Hepatitis B-Post 509 > OR = 10 mIU/mL    Comment: . Patient has immunity to hepatitis B virus. . For additional information, please refer to http://education.[facility].com/faq/FAQ105 (This link  is being provided for informational/ educational purposes only).   FSH     Status: Abnormal   Collection Time: 11/26/17  4:37 PM  Result Value Ref Range   FSH <0.7 (L) mIU/mL    Comment:                     Reference Range .  Follicular Phase       2.5-10.2              Mid-cycle Peak         3.1-17.7              Luteal Phase           1.5- 9.1              Postmenopausal       23.0-116.3              .   Estradiol     Status: None   Collection Time: 11/26/17  4:37 PM  Result Value Ref Range   Estradiol <15 pg/mL    Comment:       Reference Range         Follicular Phase:    19-144         Mid-Cycle:           64-357         Luteal Phase:        56-214         Postmenopausal:      < or = 31 . Reference range established on post-pubertal patient population. No pre-pubertal reference range established using this assay. For any patients for whom low Estradiol levels are anticipated (e.g. males, pre-pubertal children and hypogonadal/post-menopausal  females), the [employer] Estradiol, Ultrasensitive, LCMSMS assay is recommended (order code 16109). . Please note: patients being treated with the drug  fulvestrant (Faslodex(R)) have demonstrated significant  interference in immunoassay methods for estradiol  measurement. The cross reactivity could lead to falsely  elevated estradiol test results leading to an  inappropriate clinical assessment of estrogen status. [facility] order code 30289-Estradiol,  Ultrasensitive LC/MS/MS demonstrates negligible cross  re activity with fulvestrant.   Testosterone, Total, LC/MS/MS     Status: None   Collection Time: 11/26/17  4:37 PM  Result Value Ref Range   Testosterone, Total, LC-MS-MS CANCELED     Comment:    POCT urine pregnancy     Status: Normal   Collection Time: 11/26/17  4:42 PM  Result Value Ref Range   Preg Test, Ur Negative Negative   Objective  Body mass index is 38.12  kg/m. Wt Readings from Last 3 Encounters:  11/26/17 215 lb 3.2 oz (97.6 kg)  08/22/17 231 lb (104.8 kg)  08/14/17 231 lb (104.8 kg)   Temp Readings from Last 3 Encounters:  11/26/17 98.3 F (36.8 C) (Oral)  08/23/17 98.4 F (36.9 C)  08/14/17 98.1 F (36.7 C)   BP Readings from Last 3 Encounters:  11/26/17 130/80  08/23/17 119/74  08/15/17 128/77   Pulse Readings from Last 3 Encounters:  11/26/17 91  08/23/17 78  08/15/17 72    Physical Exam  Constitutional: She is oriented to person, place, and time. Vital signs are normal. She appears well-developed and well-nourished.  HENT:  Head: Normocephalic and atraumatic.  Mouth/Throat: Oropharynx is clear and moist and mucous membranes are normal.  Eyes: Pupils are equal, round, and reactive to light. Conjunctivae are normal.  Cardiovascular: Normal rate, regular rhythm and normal heart sounds.  Pulmonary/Chest: Effort normal and breath sounds normal.  Neurological: She is alert and oriented to person, place, and time. Gait normal.  Skin: Skin is warm, dry and intact.  Psychiatric: She has a normal mood and affect. Her speech is normal and behavior is normal. Judgment and thought content normal. Cognition and memory  are normal.  Nursing note and vitals reviewed.   Assessment   1. Irregular menses, amenorrhea urine preg neg  2. Vit D def  3. HM Plan   1.  Refer to Dr. Osborn Coho further w/u do hormone testing today FSH, estradiol, check urine preg  Check testosterone and prolactin in future  Check CMET, CBC, TSH, T4, UA,  Has prev been to [facility] Dr. Juliene Pina but prefers to see Dr. Osborn Coho  2. Check vit D  3.  Flu shot had fall 2013  Tdap up to date  pna 23 had 12/26/12  Check hep B status  Check lipid in future  Pap had 02/2016 request records [facility] Dr. Theotis Barrio ob/gyn 04/19/18 Dr. Sallye Ober did STD check and pap no results in notes.   Provider: Dr. French Ana McLean-Scocuzza-Internal Medicine

## 2017-12-15 ENCOUNTER — Encounter: Payer: Self-pay | Admitting: Internal Medicine

## 2018-01-26 ENCOUNTER — Other Ambulatory Visit (INDEPENDENT_AMBULATORY_CARE_PROVIDER_SITE_OTHER)

## 2018-01-26 DIAGNOSIS — N926 Irregular menstruation, unspecified: Secondary | ICD-10-CM | POA: Diagnosis not present

## 2018-01-26 DIAGNOSIS — N912 Amenorrhea, unspecified: Secondary | ICD-10-CM

## 2018-01-26 DIAGNOSIS — Z13220 Encounter for screening for lipoid disorders: Secondary | ICD-10-CM | POA: Diagnosis not present

## 2018-01-26 LAB — TESTOSTERONE: Testosterone: 22.23 ng/dL (ref 15.00–40.00)

## 2018-01-26 LAB — LIPID PANEL
CHOL/HDL RATIO: 3
Cholesterol: 180 mg/dL (ref 0–200)
HDL: 54.5 mg/dL (ref >39.00)
LDL Cholesterol: 113 mg/dL — ABNORMAL HIGH (ref 0–99)
NONHDL: 125.32
Triglycerides: 60 mg/dL (ref 0.0–149.0)
VLDL: 12 mg/dL (ref 0.0–40.0)

## 2018-01-27 LAB — PROLACTIN: Prolactin: 6.5 ng/mL

## 2018-02-28 ENCOUNTER — Ambulatory Visit: Admitting: Internal Medicine

## 2018-04-19 LAB — HM PAP SMEAR: HM Pap smear: NORMAL

## 2018-04-20 LAB — HM PAP SMEAR: HM PAP: NORMAL

## 2018-05-12 ENCOUNTER — Ambulatory Visit: Admitting: Internal Medicine

## 2018-05-18 ENCOUNTER — Ambulatory Visit (INDEPENDENT_AMBULATORY_CARE_PROVIDER_SITE_OTHER): Admitting: Internal Medicine

## 2018-05-18 ENCOUNTER — Encounter: Payer: Self-pay | Admitting: Internal Medicine

## 2018-05-18 VITALS — BP 102/64 | HR 86 | Temp 98.6°F | Ht 63.0 in | Wt 208.8 lb

## 2018-05-18 DIAGNOSIS — Z23 Encounter for immunization: Secondary | ICD-10-CM | POA: Diagnosis not present

## 2018-05-18 DIAGNOSIS — Z1329 Encounter for screening for other suspected endocrine disorder: Secondary | ICD-10-CM

## 2018-05-18 DIAGNOSIS — J45990 Exercise induced bronchospasm: Secondary | ICD-10-CM | POA: Diagnosis not present

## 2018-05-18 DIAGNOSIS — N912 Amenorrhea, unspecified: Secondary | ICD-10-CM

## 2018-05-18 DIAGNOSIS — E559 Vitamin D deficiency, unspecified: Secondary | ICD-10-CM

## 2018-05-18 DIAGNOSIS — Z8719 Personal history of other diseases of the digestive system: Secondary | ICD-10-CM

## 2018-05-18 DIAGNOSIS — Z0000 Encounter for general adult medical examination without abnormal findings: Secondary | ICD-10-CM

## 2018-05-18 DIAGNOSIS — Z13220 Encounter for screening for lipoid disorders: Secondary | ICD-10-CM

## 2018-05-18 DIAGNOSIS — Z0184 Encounter for antibody response examination: Secondary | ICD-10-CM

## 2018-05-18 DIAGNOSIS — Z1159 Encounter for screening for other viral diseases: Secondary | ICD-10-CM

## 2018-05-18 DIAGNOSIS — R319 Hematuria, unspecified: Secondary | ICD-10-CM

## 2018-05-18 MED ORDER — ALBUTEROL SULFATE HFA 108 (90 BASE) MCG/ACT IN AERS: 1.0000 | INHALATION_SPRAY | RESPIRATORY_TRACT | 12 refills | Status: DC | PRN

## 2018-05-18 NOTE — Patient Instructions (Addendum)
D3 1000 IU daily    Dermatosis Papulosa Nigra   Exercising to [employer] Exercising can help you to lose weight. In order to lose weight through exercise, you need to do vigorous-intensity exercise. You can tell that you are exercising with vigorous intensity if you are breathing very hard and fast and cannot hold a conversation while exercising. Moderate-intensity exercise helps to maintain your current weight. You can tell that you are exercising at a moderate level if you have a higher heart rate and faster breathing, but you are still able to hold a conversation. How often should I exercise? Choose an activity that you enjoy and set realistic goals. Your health care provider can help you to make an activity plan that works for you. Exercise regularly as directed by your health care provider. This may include:  Doing resistance training twice each week, such as: ? Push-ups. ? Sit-ups. ? Lifting weights. ? Using resistance bands.  Doing a given intensity of exercise for a given amount of time. Choose from these options: ? 150 minutes of moderate-intensity exercise every week. ? 75 minutes of vigorous-intensity exercise every week. ? A mix of moderate-intensity and vigorous-intensity exercise every week.  Children, pregnant women, people who are out of shape, people who are overweight, and older adults may need to consult a health care provider for individual recommendations. If you have any sort of medical condition, be sure to consult your health care provider before starting a new exercise program. What are some activities that can help me to lose weight?  Walking at a rate of at least 4.5 miles an hour.  Jogging or running at a rate of 5 miles per hour.  Biking at a rate of at least 10 miles per hour.  Lap swimming.  Roller-skating or in-line skating.  Cross-country skiing.  Vigorous competitive sports, such as football, basketball, and soccer.  Jumping rope.  Aerobic  dancing. How can I be more active in my day-to-day activities?  Use the stairs instead of the elevator.  Take a walk during your lunch break.  If you drive, park your car farther away from work or school.  If you take public transportation, get off one stop early and walk the rest of the way.  Make all of your phone calls while standing up and walking around.  Get up, stretch, and walk around every 30 minutes throughout the day. What guidelines should I follow while exercising?  Do not exercise so much that you hurt yourself, feel dizzy, or get very short of breath.  Consult your health care provider prior to starting a new exercise program.  Wear comfortable clothes and shoes with good support.  Drink plenty of water while you exercise to prevent dehydration or heat stroke. Body water is lost during exercise and must be replaced.  Work out until you breathe faster and your heart beats faster. This information is not intended to replace advice given to you by your health care provider. Make sure you discuss any questions you have with your health care provider. Document Released: 09/12/2010 Document Revised: 01/16/2016 Document Reviewed: 01/11/2014 Elsevier Interactive Patient Education  2018 Elsevier Inc.  Seborrheic Keratosis Seborrheic keratosis is a common, noncancerous (benign) skin growth. This condition causes waxy, rough, tan, brown, or black spots to appear on the skin. These skin growths can be flat or raised. What are the causes? The cause of this condition is not known. What increases the risk? This condition is more likely to develop  in:  People who have a family history of seborrheic keratosis.  People who are 21 or older.  People who are pregnant.  People who have had estrogen replacement therapy.  What are the signs or symptoms? This condition often occurs on the face, chest, shoulders, back, or other areas. These growths:  Are usually painless, but may  become irritated and itchy.  Can be yellow, brown, black, or other colors.  Are slightly raised or have a flat surface.  Are sometimes rough or wart-like in texture.  Are often waxy on the surface.  Are round or oval-shaped.  Sometimes look like they are "stuck on."  Often occur in groups, but may occur as a single growth.  How is this diagnosed? This condition is diagnosed with a medical history and physical exam. A sample of the growth may be tested (skin biopsy). You may need to see a skin specialist (dermatologist). How is this treated? Treatment is not usually needed for this condition, unless the growths are irritated or are often bleeding. You may also choose to have the growths removed if you do not like their appearance. Most commonly, these growths are treated with a procedure in which liquid nitrogen is applied to "freeze" off the growth (cryosurgery). They may also be burned off with electricity or cut off. Follow these instructions at home:  Watch your growth for any changes.  Keep all follow-up visits as told by your health care provider. This is important.  Do not scratch or pick at the growth or growths. This can cause them to become irritated or infected. Contact a health care provider if:  You suddenly have many new growths.  Your growth bleeds, itches, or hurts.  Your growth suddenly becomes larger or changes color. This information is not intended to replace advice given to you by your health care provider. Make sure you discuss any questions you have with your health care provider. Document Released: 09/12/2010 Document Revised: 01/16/2016 Document Reviewed: 12/26/2014 Elsevier Interactive Patient Education  2018 [employer].

## 2018-05-18 NOTE — Progress Notes (Signed)
Pre visit review using our clinic review tool, if applicable. No additional management support is needed unless otherwise documented below in the visit note. 

## 2018-05-18 NOTE — Progress Notes (Addendum)
Chief Complaint  Patient presents with  . Follow-up   F/u doing well  1. Exercise asthma now that working out flares  2. C/o excess moles to trunk FH p uncle with stomach cancer and mom has a lot of moles to face as well  3 .amenorrhea resolved but lasing shorter 3-4 days  4. GERD sx's controlled    Review of Systems  Constitutional: Negative for weight loss.  HENT: Negative for hearing loss.   Eyes: Negative for blurred vision.  Cardiovascular: Negative for chest pain.  Gastrointestinal: Negative for abdominal pain.  Musculoskeletal: Negative for falls.  Skin: Negative for rash.  Neurological: Negative for headaches.  Psychiatric/Behavioral: Negative for depression.   Past Medical History:  Diagnosis Date  . Abnormal Pap smear 2004,2005   Hx of.  . Acid reflux   . Asthma    Induced w/ weather changes;inhaler prn  . Migraines 2012  . Seizure ([place])    x 2 as a Toddler   Past Surgical History:  Procedure Laterality Date  . WISDOM TOOTH EXTRACTION  2007   all 4 removed   Family History  Problem Relation Age of Onset  . Stroke Maternal Grandfather   . Hypertension Maternal Grandfather   . Cancer Paternal Uncle        Throat  . Cancer Paternal Grandmother        Unsure which type  . Migraines Father   . Asthma Sister        x 2  . Asthma Cousin        Maternal  . Asthma Other        Nephew  . Kidney failure Maternal Uncle   . Lupus Sister   . Diverticulitis Mother   . Stroke Maternal Grandmother    Social History   Socioeconomic History  . Marital status: Married    Spouse name: Not on file  . Number of children: Not on file  . Years of education: Not on file  . Highest education level: Not on file  Occupational History  . Occupation: CSR    Employer: [employer]  . Financial resource strain: Not on file  . Food insecurity:    Worry: Not on file    Inability: Not on file  . Transportation needs:     Medical: Not on file    Non-medical: Not on file  Tobacco Use  . Smoking status: Never Smoker  . Smokeless tobacco: Never Used  Substance and Sexual Activity  . Alcohol use: Yes    Alcohol/week: 2.0 standard drinks    Types: 2 Standard drinks or equivalent per week    Comment: socially   . Drug use: No  . Sexual activity: Yes    Partners: Male    Birth control/protection: Pill  Lifestyle  . Physical activity:    Days per week: Not on file    Minutes per session: Not on file  . Stress: Not on file  Relationships  . Social connections:    Talks on phone: Not on file    Gets together: Not on file    Attends religious service: Not on file    Active member of club or organization: Not on file    Attends meetings of clubs or organizations: Not on file    Relationship status: Not on file  . Intimate partner violence:    Fear of current or ex partner: Not on file  Emotionally abused: Not on file    Physically abused: Not on file    Forced sexual activity: Not on file  Other Topics Concern  . Not on file  Social History Narrative   Married with children   Current Meds  Medication Sig  . acetaminophen (TYLENOL) 500 MG tablet Take 1,000 mg by mouth every 6 (six) hours as needed for moderate pain or headache.  . albuterol (PROVENTIL HFA;VENTOLIN HFA) 108 (90 BASE) MCG/ACT inhaler Inhale 2 puffs into the lungs every 6 (six) hours as needed for wheezing or shortness of breath. Rescue inhaler  . ibuprofen (ADVIL,MOTRIN) 600 MG tablet Take 1 tablet (600 mg total) by mouth every 6 (six) hours.  . Iron-FA-B Cmp-C-Biot-Probiotic (FUSION PLUS) CAPS Take 1 capsule by mouth daily.  . norethindrone-ethinyl estradiol-iron (MICROGESTIN FE,GILDESS FE,LOESTRIN FE) 1.5-30 MG-MCG tablet Take 1 tablet by mouth daily.  . Prenatal Multivit-Min-Fe-FA (PRENATAL VITAMINS) 0.8 MG tablet Take 1 tablet by mouth daily.   No Known Allergies No results found for this or any previous visit (from the past 2160  hour(s)). Objective  Body mass index is 36.99 kg/m. Wt Readings from Last 3 Encounters:  05/18/18 208 lb 12.8 oz (94.7 kg)  11/26/17 215 lb 3.2 oz (97.6 kg)  08/22/17 231 lb (104.8 kg)   Temp Readings from Last 3 Encounters:  05/18/18 98.6 F (37 C) (Oral)  11/26/17 98.3 F (36.8 C) (Oral)  08/23/17 98.4 F (36.9 C)   BP Readings from Last 3 Encounters:  05/18/18 102/64  11/26/17 130/80  08/23/17 119/74   Pulse Readings from Last 3 Encounters:  05/18/18 86  11/26/17 91  08/23/17 78    Physical Exam  Constitutional: She is oriented to person, place, and time. Vital signs are normal. She appears well-developed and well-nourished. She is cooperative.  HENT:  Head: Normocephalic and atraumatic.  Mouth/Throat: Oropharynx is clear and moist and mucous membranes are normal.  Eyes: Pupils are equal, round, and reactive to light. Conjunctivae are normal.  Cardiovascular: Normal rate, regular rhythm and normal heart sounds.  Pulmonary/Chest: Effort normal and breath sounds normal.  Neurological: She is alert and oriented to person, place, and time. Gait normal.  Skin: Skin is warm, dry and intact.  DPNS face  sks trunk   Psychiatric: She has a normal mood and affect. Her speech is normal and behavior is normal. Judgment and thought content normal. Cognition and memory are normal.  Nursing note and vitals reviewed.   Assessment   1. Exercise induced asthma  2. DPNS and sks to face  3. Amenorrhea improved  4. GERD 5. HM Plan   1. Refilled inhaler  2. Monitor  3. ntd f/u [facility]  4. montior  5.  Flu shot today Tdap up to date  pna 23 had 12/26/12 consider repeat Immune hep B  Check lipid in future and comprehensive labs with MMR Pap had 02/2016 request records [facility] Dr. Benjie Karvonen  -pap 04/20/18 neg neg HPV Saw ob/gyn 04/19/18 Dr. Alesia Richards did STD check and pap no results in notes.  -requested pap   Provider: Dr. Olivia Mackie McLean-Scocuzza-Internal Medicine

## 2018-09-14 ENCOUNTER — Telehealth: Payer: Self-pay | Admitting: Internal Medicine

## 2018-11-29 ENCOUNTER — Other Ambulatory Visit: Payer: Self-pay | Admitting: Internal Medicine

## 2018-11-29 ENCOUNTER — Other Ambulatory Visit: Payer: Self-pay

## 2018-11-29 ENCOUNTER — Other Ambulatory Visit (INDEPENDENT_AMBULATORY_CARE_PROVIDER_SITE_OTHER)

## 2018-11-29 DIAGNOSIS — Z0000 Encounter for general adult medical examination without abnormal findings: Secondary | ICD-10-CM

## 2018-11-29 DIAGNOSIS — E559 Vitamin D deficiency, unspecified: Secondary | ICD-10-CM

## 2018-11-29 DIAGNOSIS — R319 Hematuria, unspecified: Secondary | ICD-10-CM

## 2018-11-29 DIAGNOSIS — Z1329 Encounter for screening for other suspected endocrine disorder: Secondary | ICD-10-CM

## 2018-11-29 DIAGNOSIS — R7989 Other specified abnormal findings of blood chemistry: Secondary | ICD-10-CM

## 2018-11-29 DIAGNOSIS — Z1159 Encounter for screening for other viral diseases: Secondary | ICD-10-CM

## 2018-11-29 DIAGNOSIS — R945 Abnormal results of liver function studies: Principal | ICD-10-CM

## 2018-11-29 DIAGNOSIS — Z13220 Encounter for screening for lipoid disorders: Secondary | ICD-10-CM

## 2018-11-29 DIAGNOSIS — Z0184 Encounter for antibody response examination: Secondary | ICD-10-CM

## 2018-11-29 LAB — CBC WITH DIFFERENTIAL/PLATELET
Basophils Absolute: 0.1 10*3/uL (ref 0.0–0.1)
Basophils Relative: 1 % (ref 0.0–3.0)
Eosinophils Absolute: 0.2 10*3/uL (ref 0.0–0.7)
Eosinophils Relative: 3.1 % (ref 0.0–5.0)
HCT: 38.7 % (ref 36.0–46.0)
Hemoglobin: 12.8 g/dL (ref 12.0–15.0)
Lymphocytes Relative: 38.5 % (ref 12.0–46.0)
Lymphs Abs: 2.8 10*3/uL (ref 0.7–4.0)
MCHC: 32.9 g/dL (ref 30.0–36.0)
MCV: 82.4 fl (ref 78.0–100.0)
Monocytes Absolute: 0.5 10*3/uL (ref 0.1–1.0)
Monocytes Relative: 6.6 % (ref 3.0–12.0)
Neutro Abs: 3.7 10*3/uL (ref 1.4–7.7)
Neutrophils Relative %: 50.8 % (ref 43.0–77.0)
Platelets: 252 10*3/uL (ref 150.0–400.0)
RBC: 4.7 Mil/uL (ref 3.87–5.11)
RDW: 14.2 % (ref 11.5–15.5)
WBC: 7.4 10*3/uL (ref 4.0–10.5)

## 2018-11-29 LAB — LIPID PANEL
Cholesterol: 200 mg/dL (ref 0–200)
HDL: 64.4 mg/dL (ref >39.00)
LDL Cholesterol: 126 mg/dL — ABNORMAL HIGH (ref 0–99)
NonHDL: 135.67
Total CHOL/HDL Ratio: 3
Triglycerides: 46 mg/dL (ref 0.0–149.0)
VLDL: 9.2 mg/dL (ref 0.0–40.0)

## 2018-11-29 LAB — COMPREHENSIVE METABOLIC PANEL
ALT: 37 U/L — ABNORMAL HIGH (ref 0–35)
AST: 29 U/L (ref 0–37)
Albumin: 4.1 g/dL (ref 3.5–5.2)
Alkaline Phosphatase: 59 U/L (ref 39–117)
BUN: 13 mg/dL (ref 6–23)
CO2: 28 mEq/L (ref 19–32)
Calcium: 9.3 mg/dL (ref 8.4–10.5)
Chloride: 104 mEq/L (ref 96–112)
Creatinine, Ser: 0.88 mg/dL (ref 0.40–1.20)
GFR: 88.31 mL/min (ref >60.00)
Glucose, Bld: 85 mg/dL (ref 70–99)
Potassium: 3.6 mEq/L (ref 3.5–5.1)
Sodium: 138 mEq/L (ref 135–145)
Total Bilirubin: 0.6 mg/dL (ref 0.2–1.2)
Total Protein: 7.7 g/dL (ref 6.0–8.3)

## 2018-11-29 LAB — VITAMIN D 25 HYDROXY (VIT D DEFICIENCY, FRACTURES): VITD: 22.5 ng/mL — ABNORMAL LOW (ref 30.00–100.00)

## 2018-11-29 LAB — TSH: TSH: 2.17 u[IU]/mL (ref 0.35–4.50)

## 2018-11-29 NOTE — Addendum Note (Signed)
Addended by: Penne Lash on: 11/29/2018 08:58 AM   Modules accepted: Orders

## 2018-11-30 LAB — URINALYSIS, ROUTINE W REFLEX MICROSCOPIC
Bilirubin, UA: NEGATIVE
Glucose, UA: NEGATIVE
Ketones, UA: NEGATIVE
Leukocytes,UA: NEGATIVE
Nitrite, UA: NEGATIVE
Protein,UA: NEGATIVE
Specific Gravity, UA: 1.014 (ref 1.005–1.030)
Urobilinogen, Ur: 0.2 mg/dL (ref 0.2–1.0)
pH, UA: 5.5 (ref 5.0–7.5)

## 2018-11-30 LAB — MEASLES/MUMPS/RUBELLA IMMUNITY
Mumps IgG: 161 AU/mL
Rubella: 3.88 index
Rubeola IgG: 83.6 AU/mL

## 2018-11-30 LAB — MICROSCOPIC EXAMINATION: Casts: NONE SEEN /lpf

## 2018-12-06 ENCOUNTER — Ambulatory Visit (INDEPENDENT_AMBULATORY_CARE_PROVIDER_SITE_OTHER): Admitting: Internal Medicine

## 2018-12-06 DIAGNOSIS — R748 Abnormal levels of other serum enzymes: Secondary | ICD-10-CM | POA: Diagnosis not present

## 2018-12-06 DIAGNOSIS — E559 Vitamin D deficiency, unspecified: Secondary | ICD-10-CM | POA: Diagnosis not present

## 2018-12-06 DIAGNOSIS — E785 Hyperlipidemia, unspecified: Secondary | ICD-10-CM | POA: Diagnosis not present

## 2018-12-06 DIAGNOSIS — J45990 Exercise induced bronchospasm: Secondary | ICD-10-CM

## 2018-12-06 DIAGNOSIS — J309 Allergic rhinitis, unspecified: Secondary | ICD-10-CM

## 2018-12-06 MED ORDER — MONTELUKAST SODIUM 10 MG PO TABS: 10.0000 mg | ORAL_TABLET | Freq: Every day | ORAL | 0 refills | Status: DC

## 2018-12-06 NOTE — Progress Notes (Addendum)
Virtual Visit via Video Note Doxy  I connected with Sylvia Townsend  on 12/06/18 at  9:40 AM EDT by a video enabled telemedicine application and verified that I am speaking with the correct person using two identifiers.  Location patient: home Location provider:work  Persons participating in the virtual visit: patient, provider  I discussed the limitations of evaluation and management by telemedicine and the availability of in person appointments. The patient expressed understanding and agreed to proceed.   HPI:  1. Reviewed labs low vitamin D has D3 2000 Iu and elevated ALT 37 nl 35 and elevated cholesterol pt just recently changed diet will mail cholesterol handout. Trace blood she just finished her cycle   She was concerned b/c fathers aunt just dx'ed liver cancer   2. Asthma flaring with pollen clearing throat more and using prn Albuterol inhaler more and sneezing sxs worse at night voice is going away.   ROS: See pertinent positives and negatives per HPI.  Past Medical History:  Diagnosis Date  . Abnormal Pap smear 2004,2005   Hx of.  . Acid reflux   . Asthma    Induced w/ weather changes;inhaler prn  . Migraines 2012  . Seizure ([place])    x 2 as a Toddler    Past Surgical History:  Procedure Laterality Date  . WISDOM TOOTH EXTRACTION  2007   all 4 removed    Family History  Problem Relation Age of Onset  . Stroke Maternal Grandfather   . Hypertension Maternal Grandfather   . Cancer Paternal Uncle        Throat  . Cancer Paternal Grandmother        Unsure which type  . Migraines Father   . Asthma Sister        x 2  . Asthma Cousin        Maternal  . Asthma Other        Nephew  . Kidney failure Maternal Uncle   . Lupus Sister   . Diverticulitis Mother   . Stroke Maternal Grandmother   . Cancer Paternal Uncle        stomach cancer     SOCIAL HX: lives with husband and 2 kids    Current Outpatient Medications:  .  acetaminophen (TYLENOL) 500 MG  tablet, Take 1,000 mg by mouth every 6 (six) hours as needed for moderate pain or headache., Disp: , Rfl:  .  albuterol (PROVENTIL HFA;VENTOLIN HFA) 108 (90 Base) MCG/ACT inhaler, Inhale 1-2 puffs into the lungs every 4 (four) hours as needed for wheezing or shortness of breath. Rescue inhaler, Disp: 1 Inhaler, Rfl: 12 .  ibuprofen (ADVIL,MOTRIN) 600 MG tablet, Take 1 tablet (600 mg total) by mouth every 6 (six) hours., Disp: 30 tablet, Rfl: 0 .  Iron-FA-B Cmp-C-Biot-Probiotic (FUSION PLUS) CAPS, Take 1 capsule by mouth daily., Disp: , Rfl: 4 .  montelukast (SINGULAIR) 10 MG tablet, Take 1 tablet (10 mg total) by mouth at bedtime., Disp: 30 tablet, Rfl: 0 .  norethindrone-ethinyl estradiol-iron (MICROGESTIN FE,GILDESS FE,LOESTRIN FE) 1.5-30 MG-MCG tablet, Take 1 tablet by mouth daily., Disp: , Rfl:  .  Prenatal Multivit-Min-Fe-FA (PRENATAL VITAMINS) 0.8 MG tablet, Take 1 tablet by mouth daily., Disp: , Rfl:   EXAM:  VITALS per patient if applicable:  GENERAL: alert, oriented, appears well and in no acute distress  HEENT: atraumatic, conjunttiva clear, no obvious abnormalities on inspection of external nose and ears  NECK: normal movements of the head and neck  LUNGS: on  inspection no signs of respiratory distress, breathing rate appears normal, no obvious gross SOB, gasping or wheezing  CV: no obvious cyanosis  MS: moves all visible extremities without noticeable abnormality  PSYCH/NEURO: pleasant and cooperative, no obvious depression or anxiety, speech and thought processing grossly intact  ASSESSMENT AND PLAN:  Discussed the following assessment and plan:  Exercise-induced asthma - Plan: montelukast (SINGULAIR) 10 MG tablet asthma flaring with pollen rec try xyzal instead of claritin  Vitamin D deficiency-rec use 4000 iu D3 for now then 5000 IU D3 daily   Hyperlipidemia, unspecified hyperlipidemia type -mail cholesterol hand and rec healthy diet and exercise   Elevated liver  enzymes -repeat 03/07/2019 10 am if abnormal do US liver in future   HM Flu shot due 2020  Tdap up to datenotes state had 05/27/17 and possibly 12/26/12  ob/gyn  pna 23 had 12/26/12 consider repeat with asthma hx  Immune hep B and MMR  Pap had 02/2016 request records [facility] Dr. Benjie Karvonen -pap 04/20/18 neg neg HPV Saw ob/gyn 04/19/18 Dr. Alesia Richards did STD check and pap no results in notes. -requested pap   Saw [facility] Dr. Alesia Richards 05/16/19  H/o anxiety and binge eating rec disc with PCP and checked STDS CT/NG/TV/HIV/HBsAg/RPR/HCV and yeast vaginitis    I discussed the assessment and treatment plan with the patient. The patient was provided an opportunity to ask questions and all were answered. The patient agreed with the plan and demonstrated an understanding of the instructions.   The patient was advised to call back or seek an in-person evaluation if the symptoms worsen or if the condition fails to improve as anticipated.  Time spent 15 minutes  Delorise Jackson, MD

## 2018-12-06 NOTE — Addendum Note (Signed)
Addended by: Quentin Ore on: 12/06/2018 11:59 AM   Modules accepted: Orders

## 2019-01-02 ENCOUNTER — Telehealth: Payer: Self-pay

## 2019-01-02 NOTE — Telephone Encounter (Signed)
Copied from CRM [id number]. Topic: Appointment Scheduling - Scheduling Inquiry for Clinic >> Dec 30, 2018  4:18 PM Fanny Bien wrote: Reason for CRM: pt called and stated that she would like to set up an appointment. Please advise

## 2019-03-07 ENCOUNTER — Other Ambulatory Visit (INDEPENDENT_AMBULATORY_CARE_PROVIDER_SITE_OTHER)

## 2019-03-07 ENCOUNTER — Other Ambulatory Visit: Payer: Self-pay

## 2019-03-07 DIAGNOSIS — R7989 Other specified abnormal findings of blood chemistry: Secondary | ICD-10-CM

## 2019-03-07 DIAGNOSIS — R945 Abnormal results of liver function studies: Secondary | ICD-10-CM

## 2019-03-07 LAB — HEPATIC FUNCTION PANEL
ALT: 23 U/L (ref 0–35)
AST: 23 U/L (ref 0–37)
Albumin: 4.2 g/dL (ref 3.5–5.2)
Alkaline Phosphatase: 53 U/L (ref 39–117)
Bilirubin, Direct: 0.1 mg/dL (ref 0.0–0.3)
Total Bilirubin: 0.5 mg/dL (ref 0.2–1.2)
Total Protein: 7.3 g/dL (ref 6.0–8.3)

## 2019-03-16 ENCOUNTER — Encounter: Payer: Self-pay | Admitting: Internal Medicine

## 2019-03-16 ENCOUNTER — Other Ambulatory Visit: Payer: Self-pay | Admitting: Internal Medicine

## 2019-03-16 DIAGNOSIS — E854 Organ-limited amyloidosis: Secondary | ICD-10-CM

## 2019-03-16 DIAGNOSIS — I43 Cardiomyopathy in diseases classified elsewhere: Secondary | ICD-10-CM

## 2019-04-11 ENCOUNTER — Other Ambulatory Visit: Payer: Self-pay

## 2019-04-11 ENCOUNTER — Ambulatory Visit (INDEPENDENT_AMBULATORY_CARE_PROVIDER_SITE_OTHER): Admitting: Cardiology

## 2019-04-11 ENCOUNTER — Encounter: Payer: Self-pay | Admitting: Cardiology

## 2019-04-11 VITALS — BP 120/80 | HR 73 | Ht 63.0 in | Wt 190.0 lb

## 2019-04-11 DIAGNOSIS — Z8719 Personal history of other diseases of the digestive system: Secondary | ICD-10-CM | POA: Diagnosis not present

## 2019-04-11 DIAGNOSIS — Z8349 Family history of other endocrine, nutritional and metabolic diseases: Secondary | ICD-10-CM

## 2019-04-11 DIAGNOSIS — J45990 Exercise induced bronchospasm: Secondary | ICD-10-CM

## 2019-04-11 NOTE — Patient Instructions (Signed)
Medication Instructions:  Your physician recommends that you continue on your current medications as directed. Please refer to the Current Medication list given to you today.  If you need a refill on your cardiac medications before your next appointment, please call your pharmacy.   Lab work: None.   If you have labs (blood work) drawn today and your tests are completely normal, you will receive your results only by: . MyChart Message (if you have MyChart) OR . A paper copy in the mail If you have any lab test that is abnormal or we need to change your treatment, we will call you to review the results.  Testing/Procedures: Your physician has requested that you have an echocardiogram. Echocardiography is a painless test that uses sound waves to create images of your heart. It provides your doctor with information about the size and shape of your heart and how well your heart's chambers and valves are working. This procedure takes approximately one hour. There are no restrictions for this procedure.    Follow-Up: At [facility], you and your health needs are our priority.  As part of our continuing mission to provide you with exceptional heart care, we have created designated Provider Care Teams.  These Care Teams include your primary Cardiologist (physician) and Advanced Practice Providers (APPs -  Physician Assistants and Nurse Practitioners) who all work together to provide you with the care you need, when you need it. You will need a follow up appointment in 6 weeks.  Please call our office 2 months in advance to schedule this appointment.  You may see No primary care provider on file. or another member of our [facility] Provider Team in [place]: Brian Munley, MD . Rajan Revankar, MD  Any Other Special Instructions Will Be Listed Below (If Applicable).   Echocardiogram An echocardiogram is a procedure that uses painless sound waves (ultrasound) to produce an image of the  heart. Images from an echocardiogram can provide important information about:  Signs of coronary artery disease (CAD).  Aneurysm detection. An aneurysm is a weak or damaged part of an artery wall that bulges out from the normal force of blood pumping through the body.  Heart size and shape. Changes in the size or shape of the heart can be associated with certain conditions, including heart failure, aneurysm, and CAD.  Heart muscle function.  Heart valve function.  Signs of a past heart attack.  Fluid buildup around the heart.  Thickening of the heart muscle.  A tumor or infectious growth around the heart valves. Tell a health care provider about:  Any allergies you have.  All medicines you are taking, including vitamins, herbs, eye drops, creams, and over-the-counter medicines.  Any blood disorders you have.  Any surgeries you have had.  Any medical conditions you have.  Whether you are pregnant or may be pregnant. What are the risks? Generally, this is a safe procedure. However, problems may occur, including:  Allergic reaction to dye (contrast) that may be used during the procedure. What happens before the procedure? No specific preparation is needed. You may eat and drink normally. What happens during the procedure?   An IV tube may be inserted into one of your veins.  You may receive contrast through this tube. A contrast is an injection that improves the quality of the pictures from your heart.  A gel will be applied to your chest.  A wand-like tool (transducer) will be moved over your chest. The gel will   help to transmit the sound waves from the transducer.  The sound waves will harmlessly bounce off of your heart to allow the heart images to be captured in real-time motion. The images will be recorded on a computer. The procedure may vary among health care providers and hospitals. What happens after the procedure?  You may return to your normal, everyday  life, including diet, activities, and medicines, unless your health care provider tells you not to do that. Summary  An echocardiogram is a procedure that uses painless sound waves (ultrasound) to produce an image of the heart.  Images from an echocardiogram can provide important information about the size and shape of your heart, heart muscle function, heart valve function, and fluid buildup around your heart.  You do not need to do anything to prepare before this procedure. You may eat and drink normally.  After the echocardiogram is completed, you may return to your normal, everyday life, unless your health care provider tells you not to do that. This information is not intended to replace advice given to you by your health care provider. Make sure you discuss any questions you have with your health care provider. Document Released: 08/07/2000 Document Revised: 12/01/2018 Document Reviewed: 09/12/2016 Elsevier Patient Education  2020 Elsevier Inc.    

## 2019-04-11 NOTE — Progress Notes (Signed)
Cardiology Consultation:    Date:  04/11/2019   ID:  Sylvia Townsend, DOB [birth year], MRN [id number]  PCP:  McLean-Scocuzza, Nino Glow, MD  Cardiologist:  Jenne Campus, MD   Referring MD: McLean-Scocuzza, Olivia Mackie *   Chief Complaint  Patient presents with  . Palpitations    family hx amyloidosis, evaluation for    History of Present Illness:    Sylvia Townsend is a 38 y.o. female who is being seen today for the evaluation of family history of amyloidosis at the request of McLean-Scocuzza, Olivia Mackie *.  She is a young healthy woman she does have exercise-induced asthma otherwise seems to be doing well overall recently her father was diagnosed with amyloidosis however I have no more information about that condition and her father.  She would like to be checked make sure she does not have a problem.  Overall she is asymptomatic.  She walks climb steps with no difficulties she exercise 3 times a week with a personal trainer over the zone.  And she has no difficulty doing it.  She does have exercise-induced asthma typically happens with very cold day over a hot day.  She is some bronchodilators with very good response to it. She does not have risk factors for coronary artery disease except mildly elevated cholesterol but her HDL is high 64 with LDL 126 She does not smoke never date  Past Medical History:  Diagnosis Date  . Abnormal Pap smear 2004,2005   Hx of.  . Acid reflux   . Asthma    Induced w/ weather changes;inhaler prn  . Migraines 2012  . Seizure ([place])    x 2 as a Toddler    Past Surgical History:  Procedure Laterality Date  . [place] EXTRACTION  2007   all 4 removed    Current Medications: Current Meds  Medication Sig  . acetaminophen (TYLENOL) 500 MG tablet Take 1,000 mg by mouth every 6 (six) hours as needed for moderate pain or headache.  . albuterol (PROVENTIL HFA;VENTOLIN HFA) 108 (90 Base) MCG/ACT inhaler Inhale 1-2 puffs into the lungs every 4 (four)  hours as needed for wheezing or shortness of breath. Rescue inhaler  . ibuprofen (ADVIL,MOTRIN) 600 MG tablet Take 1 tablet (600 mg total) by mouth every 6 (six) hours.  . Iron-FA-B Cmp-C-Biot-Probiotic (FUSION PLUS) CAPS Take 1 capsule by mouth daily.  . Multiple Vitamin (MULTIVITAMIN) tablet Take 1 tablet by mouth daily.     Allergies:   Patient has no known allergies.   Social History   Socioeconomic History  . Marital status: Married    Spouse name: Not on file  . Number of children: Not on file  . Years of education: Not on file  . Highest education level: Not on file  Occupational History  . Occupation: CSR    Employer: [employer]  . Financial resource strain: Not on file  . Food insecurity    Worry: Not on file    Inability: Not on file  . Transportation needs    Medical: Not on file    Non-medical: Not on file  Tobacco Use  . Smoking status: Never Smoker  . Smokeless tobacco: Never Used  Substance and Sexual Activity  . Alcohol use: Yes    Alcohol/week: 2.0 standard drinks    Types: 2 Standard drinks or equivalent per week    Comment: socially   . Drug use: No  .  Sexual activity: Yes    Partners: Male    Birth control/protection: Pill  Lifestyle  . Physical activity    Days per week: Not on file    Minutes per session: Not on file  . Stress: Not on file  Relationships  . Social Musicianconnections    Talks on phone: Not on file    Gets together: Not on file    Attends religious service: Not on file    Active member of club or organization: Not on file    Attends meetings of clubs or organizations: Not on file    Relationship status: Not on file  Other Topics Concern  . Not on file  Social History Narrative   Married with children 2 children      Family History: The patient's family history includes Asthma in her cousin, sister, and another family member; Cancer in her paternal grandmother, paternal uncle, and  paternal uncle; Diverticulitis in her mother; Hypertension in her maternal grandfather; Kidney failure in her maternal uncle; Lupus in her sister; Migraines in her father; Other in her father; Stroke in her maternal grandfather and maternal grandmother. ROS:   Please see the history of present illness.    All 14 point review of systems negative except as described per history of present illness.  EKGs/Labs/Other Studies Reviewed:    The following studies were reviewed today:     Recent Labs: 11/29/2018: BUN 13; Creatinine, Ser 0.88; Hemoglobin 12.8; Platelets 252.0; Potassium 3.6; Sodium 138; TSH 2.17 03/07/2019: ALT 23  Recent Lipid Panel    Component Value Date/Time   CHOL 200 11/29/2018 0859   TRIG 46.0 11/29/2018 0859   HDL 64.40 11/29/2018 0859   CHOLHDL 3 11/29/2018 0859   VLDL 9.2 11/29/2018 0859   LDLCALC 126 (H) 11/29/2018 0859    Physical Exam:    VS:  BP 120/80 (BP Location: Right Arm, Patient Position: Sitting)   Pulse 73   Ht 5\' 3"  (1.6 m)   Wt 190 lb (86.2 kg)   SpO2 99%   BMI 33.66 kg/m     Wt Readings from Last 3 Encounters:  04/11/19 190 lb (86.2 kg)  05/18/18 208 lb 12.8 oz (94.7 kg)  11/26/17 215 lb 3.2 oz (97.6 kg)     GEN:  Well nourished, well developed in no acute distress HEENT: Normal NECK: No JVD; No carotid bruits LYMPHATICS: No lymphadenopathy CARDIAC: RRR, no murmurs, no rubs, no gallops RESPIRATORY:  Clear to auscultation without rales, wheezing or rhonchi  ABDOMEN: Soft, non-tender, non-distended MUSCULOSKELETAL:  No edema; No deformity  SKIN: Warm and dry NEUROLOGIC:  Alert and oriented x 3 PSYCHIATRIC:  Normal affect   ASSESSMENT:    1. Exercise-induced asthma   2. History of gastroesophageal reflux (GERD)   3. Family history of amyloidosis    PLAN:    In order of problems listed above:  1. Family history of amyloidosis.  I will get more information about the exact type that her father has.  In the meantime will do EKG as  well as echocardiogram  2. exercise-induced asthma.  That being managed excellently by internal medicine team.  She is stable 3. History of gastroesophageal reflux disease does not have any symptoms from that. 4. Mild dyslipidemia with LDL of 126 and HDL 64.  Exercises and diet for now   Medication Adjustments/Labs and Tests Ordered: Current medicines are reviewed at length with the patient today.  Concerns regarding medicines are outlined above.  No orders of the  defined types were placed in this encounter.  No orders of the defined types were placed in this encounter.   Signed, Georgeanna Leaobert J. Laterrian Hevener, MD, [facility]in Medical CenterFACC. 04/11/2019 10:19 AM    [facility]

## 2019-04-13 ENCOUNTER — Ambulatory Visit (HOSPITAL_BASED_OUTPATIENT_CLINIC_OR_DEPARTMENT_OTHER)
Admission: RE | Admit: 2019-04-13 | Discharge: 2019-04-13 | Disposition: A | Discharge status: Home / Self Care | Source: Ambulatory Visit | Attending: Cardiology | Admitting: Cardiology

## 2019-04-13 DIAGNOSIS — Z8349 Family history of other endocrine, nutritional and metabolic diseases: Secondary | ICD-10-CM | POA: Diagnosis present

## 2019-04-13 NOTE — Progress Notes (Signed)
  Echocardiogram 2D Echocardiogram has been performed.  Sylvia Townsend 04/13/2019, 2:55 PM

## 2019-04-14 NOTE — Telephone Encounter (Signed)
err

## 2019-05-26 ENCOUNTER — Telehealth: Payer: Self-pay

## 2019-05-26 NOTE — Telephone Encounter (Signed)
Copied from [place] [id number]. Topic: General - Inquiry >> May 26, 2019  4:23 PM Virl Axe D wrote: Reason for CRM: Pt would like to change OV on 05/30/19 to virtual. Call disconnected trying to contact scheduling line. Please advise.

## 2019-05-30 ENCOUNTER — Ambulatory Visit: Admitting: Internal Medicine

## 2019-05-30 DIAGNOSIS — Z0289 Encounter for other administrative examinations: Secondary | ICD-10-CM

## 2019-06-06 ENCOUNTER — Ambulatory Visit: Admitting: Cardiology

## 2019-06-26 ENCOUNTER — Encounter: Payer: Self-pay | Admitting: Cardiology

## 2019-06-26 ENCOUNTER — Telehealth (INDEPENDENT_AMBULATORY_CARE_PROVIDER_SITE_OTHER): Admitting: Cardiology

## 2019-06-26 ENCOUNTER — Other Ambulatory Visit: Payer: Self-pay

## 2019-06-26 DIAGNOSIS — R0609 Other forms of dyspnea: Secondary | ICD-10-CM | POA: Insufficient documentation

## 2019-06-26 DIAGNOSIS — R0600 Dyspnea, unspecified: Secondary | ICD-10-CM

## 2019-06-26 DIAGNOSIS — R002 Palpitations: Secondary | ICD-10-CM | POA: Insufficient documentation

## 2019-06-26 DIAGNOSIS — Z8349 Family history of other endocrine, nutritional and metabolic diseases: Secondary | ICD-10-CM

## 2019-06-26 NOTE — Addendum Note (Signed)
Addended by: Ashok Norris on: 06/26/2019 10:31 AM   Modules accepted: Orders

## 2019-06-26 NOTE — Progress Notes (Signed)
Virtual Visit via Video Note   This visit type was conducted due to national recommendations for restrictions regarding the COVID-19 Pandemic (e.g. social distancing) in an effort to limit this patient's exposure and mitigate transmission in our community.  Due to her co-morbid illnesses, this patient is at least at moderate risk for complications without adequate follow up.  This format is felt to be most appropriate for this patient at this time.  All issues noted in this document were discussed and addressed.  A limited physical exam was performed with this format.  Please refer to the patient's chart for her consent to telehealth for [facility].  Evaluation Performed:  Follow-up visit  This visit type was conducted due to national recommendations for restrictions regarding the COVID-19 Pandemic (e.g. social distancing).  This format is felt to be most appropriate for this patient at this time.  All issues noted in this document were discussed and addressed.  No physical exam was performed (except for noted visual exam findings with Video Visits).  Please refer to the patient's chart (MyChart message for video visits and phone note for telephone visits) for the patient's consent to telehealth for [facility].  Date:  06/26/2019  ID: Sylvia Townsend, DOB [birth year], MRN [id number]   Patient Location: [address] [place] [address]   Provider location:   [facility]  PCP:  McLean-Scocuzza, Nino Glow, MD  Cardiologist:  Jenne Campus, MD     Chief Complaint: Doing well  History of Present Illness:    Sylvia Townsend is a 38 y.o. female  who presents via audio/video conferencing for a telehealth visit today.  Who presented to my office initially after her father was discovered to have cardiac amyloidosis.  She went to be checked for it and see if she is at any risk of having problem at the time of my interview before she was complaining of having  some shortness of breath as well as palpitations likely old her symptoms are gone.  She also described to have some anxiety and she learned how to deal with this she does do meditation as well as she exercised on the regular basis that seems to be helping.  Denies having any chest pain, tightness, pressure, burning, squeezing the chest.   The patient does not have symptoms concerning for COVID-19 infection (fever, chills, cough, or new SHORTNESS OF BREATH).    Prior CV studies:   The following studies were reviewed today:  Echocardiogram shows structurally normal heart  EKG did not show any low voltage     Past Medical History:  Diagnosis Date  . Abnormal Pap smear 2004,2005   Hx of.  . Acid reflux   . Asthma    Induced w/ weather changes;inhaler prn  . Migraines 2012  . Seizure ([place])    x 2 as a Toddler    Past Surgical History:  Procedure Laterality Date  . WISDOM TOOTH EXTRACTION  2007   all 4 removed     Current Meds  Medication Sig  . acetaminophen (TYLENOL) 500 MG tablet Take 1,000 mg by mouth every 6 (six) hours as needed for moderate pain or headache.  . albuterol (PROVENTIL HFA;VENTOLIN HFA) 108 (90 Base) MCG/ACT inhaler Inhale 1-2 puffs into the lungs every 4 (four) hours as needed for wheezing or shortness of breath. Rescue inhaler  . ibuprofen (ADVIL,MOTRIN) 600 MG tablet Take 1 tablet (600 mg total) by mouth every 6 (six) hours.  Marland Kitchen  Iron-FA-B Cmp-C-Biot-Probiotic (FUSION PLUS) CAPS Take 1 capsule by mouth daily.  . Multiple Vitamin (MULTIVITAMIN) tablet Take 1 tablet by mouth daily.      Family History: The patient's family history includes Asthma in her cousin, sister, and another family member; Cancer in her paternal grandmother, paternal uncle, and paternal uncle; Diverticulitis in her mother; Hypertension in her maternal grandfather; Kidney failure in her maternal uncle; Lupus in her sister; Migraines in her father; Other in her father; Stroke in her  maternal grandfather and maternal grandmother.   ROS:   Please see the history of present illness.     All other systems reviewed and are negative.   Labs/Other Tests and Data Reviewed:     Recent Labs: 11/29/2018: BUN 13; Creatinine, Ser 0.88; Hemoglobin 12.8; Platelets 252.0; Potassium 3.6; Sodium 138; TSH 2.17 03/07/2019: ALT 23  Recent Lipid Panel    Component Value Date/Time   CHOL 200 11/29/2018 0859   TRIG 46.0 11/29/2018 0859   HDL 64.40 11/29/2018 0859   CHOLHDL 3 11/29/2018 0859   VLDL 9.2 11/29/2018 0859   LDLCALC 126 (H) 11/29/2018 0859      Exam:    Vital Signs:  There were no vitals taken for this visit.    Wt Readings from Last 3 Encounters:  04/11/19 190 lb (86.2 kg)  05/18/18 208 lb 12.8 oz (94.7 kg)  11/26/17 215 lb 3.2 oz (97.6 kg)     Well nourished, well developed in no acute distress. Alert awake and x3 talking to me over the video link.  She is at home I am in my office at home as well.  She is not in any distress  Diagnosis for this visit:   1. Dyspnea on exertion   2. Palpitations      ASSESSMENT & PLAN:    1.  Family history of amyloidosis (Transthyretin V122I).  So far she does not have any manifestation of the problem.  This is autosomal dominant from what I understand we spent a great deal of time talking about it however, I strongly recommended to have consultation with geneticist.  We will schedule her to have that appointment. 2.  Dyspnea on exertion denies having any.  Exercise on the regular basis and does well. 3.  Palpitations denies having any.  COVID-19 Education: The signs and symptoms of COVID-19 were discussed with the patient and how to seek care for testing (follow up with PCP or arrange E-visit).  The importance of social distancing was discussed today.  Patient Risk:   After full review of this patients clinical status, I feel that they are at least moderate risk at this time.  Time:   Today, I have spent 5 minutes  with the patient with telehealth technology discussing pt health issues.  I spent planning minutes reviewing her chart before the visit.  Visit was finished at 11:15 AM.    Medication Adjustments/Labs and Tests Ordered: Current medicines are reviewed at length with the patient today.  Concerns regarding medicines are outlined above.  No orders of the defined types were placed in this encounter.  Medication changes: No orders of the defined types were placed in this encounter.    Disposition: Follow-up 6 months, should be referred to geneticist  Signed, Georgeanna Lea, MD, [facility] 06/26/2019 10:12 AM    [facility]

## 2019-06-26 NOTE — Patient Instructions (Signed)
Medication Instructions:  Your physician recommends that you continue on your current medications as directed. Please refer to the Current Medication list given to you today.  *If you need a refill on your cardiac medications before your next appointment, please call your pharmacy*  Lab Work: None.  If you have labs (blood work) drawn today and your tests are completely normal, you will receive your results only by: Marland Kitchen MyChart Message (if you have MyChart) OR . A paper copy in the mail If you have any lab test that is abnormal or we need to change your treatment, we will call you to review the results.  Testing/Procedures: None.   Follow-Up: At [facility], you and your health needs are our priority.  As part of our continuing mission to provide you with exceptional heart care, we have created designated Provider Care Teams.  These Care Teams include your primary Cardiologist (physician) and Advanced Practice Providers (APPs -  Physician Assistants and Nurse Practitioners) who all work together to provide you with the care you need, when you need it.  Your next appointment:   6 months  The format for your next appointment:   In Person  Provider:   You may see Dr. Agustin Cree or the following Advanced Practice Provider on your designated Care Team:    Laurann Montana, FNP   Other Instructions  Dr. Agustin Cree has referred you to see Dr. Lattie Corns she should call you within 1 week, if not please call our office.

## 2019-08-03 ENCOUNTER — Other Ambulatory Visit: Payer: Self-pay

## 2019-08-03 ENCOUNTER — Encounter: Payer: Self-pay | Admitting: Internal Medicine

## 2019-08-03 ENCOUNTER — Ambulatory Visit (INDEPENDENT_AMBULATORY_CARE_PROVIDER_SITE_OTHER): Admitting: Internal Medicine

## 2019-08-03 ENCOUNTER — Encounter

## 2019-08-03 VITALS — Ht 63.0 in | Wt 190.0 lb

## 2019-08-03 DIAGNOSIS — E785 Hyperlipidemia, unspecified: Secondary | ICD-10-CM | POA: Diagnosis not present

## 2019-08-03 DIAGNOSIS — Z1389 Encounter for screening for other disorder: Secondary | ICD-10-CM

## 2019-08-03 DIAGNOSIS — N644 Mastodynia: Secondary | ICD-10-CM

## 2019-08-03 DIAGNOSIS — F419 Anxiety disorder, unspecified: Secondary | ICD-10-CM | POA: Diagnosis not present

## 2019-08-03 DIAGNOSIS — R632 Polyphagia: Secondary | ICD-10-CM | POA: Diagnosis not present

## 2019-08-03 DIAGNOSIS — F329 Major depressive disorder, single episode, unspecified: Secondary | ICD-10-CM | POA: Insufficient documentation

## 2019-08-03 DIAGNOSIS — Z1329 Encounter for screening for other suspected endocrine disorder: Secondary | ICD-10-CM

## 2019-08-03 DIAGNOSIS — Z0000 Encounter for general adult medical examination without abnormal findings: Secondary | ICD-10-CM

## 2019-08-03 DIAGNOSIS — E559 Vitamin D deficiency, unspecified: Secondary | ICD-10-CM

## 2019-08-03 DIAGNOSIS — J45990 Exercise induced bronchospasm: Secondary | ICD-10-CM

## 2019-08-03 DIAGNOSIS — F32A Depression, unspecified: Secondary | ICD-10-CM

## 2019-08-03 MED ORDER — ALBUTEROL SULFATE HFA 108 (90 BASE) MCG/ACT IN AERS: 1.0000 | INHALATION_SPRAY | RESPIRATORY_TRACT | 12 refills | Status: DC | PRN

## 2019-08-03 NOTE — Patient Instructions (Signed)
Stress Relax brand L theanine for stress/anxiety  Or  Stress relax brand tranquil sleep with L theanine/melatonin/serotonin    [facility] [place]  [phone]   Insomnia Insomnia is a sleep disorder that makes it difficult to fall asleep or stay asleep. Insomnia can cause fatigue, low energy, difficulty concentrating, mood swings, and poor performance at work or school. There are three different ways to classify insomnia:  Difficulty falling asleep.  Difficulty staying asleep.  Waking up too early in the morning. Any type of insomnia can be long-term (chronic) or short-term (acute). Both are common. Short-term insomnia usually lasts for three months or less. Chronic insomnia occurs at least three times a week for longer than three months. What are the causes? Insomnia may be caused by another condition, situation, or substance, such as:  Anxiety.  Certain medicines.  Gastroesophageal reflux disease (GERD) or other gastrointestinal conditions.  Asthma or other breathing conditions.  Restless legs syndrome, sleep apnea, or other sleep disorders.  Chronic pain.  Menopause.  Stroke.  Abuse of alcohol, tobacco, or illegal drugs.  Mental health conditions, such as depression.  Caffeine.  Neurological disorders, such as Alzheimer's disease.  An overactive thyroid (hyperthyroidism). Sometimes, the cause of insomnia may not be known. What increases the risk? Risk factors for insomnia include:  Gender. Women are affected more often than men.  Age. Insomnia is more common as you get older.  Stress.  Lack of exercise.  Irregular work schedule or working night shifts.  Traveling between different time zones.  Certain medical and mental health conditions. What are the signs or symptoms? If you have insomnia, the main symptom is having trouble falling asleep or having trouble staying asleep. This may lead to other symptoms, such  as:  Feeling fatigued or having low energy.  Feeling nervous about going to sleep.  Not feeling rested in the morning.  Having trouble concentrating.  Feeling irritable, anxious, or depressed. How is this diagnosed? This condition may be diagnosed based on:  Your symptoms and medical history. Your health care provider may ask about: ? Your sleep habits. ? Any medical conditions you have. ? Your mental health.  A physical exam. How is this treated? Treatment for insomnia depends on the cause. Treatment may focus on treating an underlying condition that is causing insomnia. Treatment may also include:  Medicines to help you sleep.  Counseling or therapy.  Lifestyle adjustments to help you sleep better. Follow these instructions at home: Eating and drinking   Limit or avoid alcohol, caffeinated beverages, and cigarettes, especially close to bedtime. These can disrupt your sleep.  Do not eat a large meal or eat spicy foods right before bedtime. This can lead to digestive discomfort that can make it hard for you to sleep. Sleep habits   Keep a sleep diary to help you and your health care provider figure out what could be causing your insomnia. Write down: ? When you sleep. ? When you wake up during the night. ? How well you sleep. ? How rested you feel the next day. ? Any side effects of medicines you are taking. ? What you eat and drink.  Make your bedroom a dark, comfortable place where it is easy to fall asleep. ? Put up shades or blackout curtains to block light from outside. ? Use a white noise machine to block noise. ? Keep the temperature cool.  Limit screen use before bedtime. This includes: ? Watching  TV. ? Using your smartphone, tablet, or computer.  Stick to a routine that includes going to bed and waking up at the same times every day and night. This can help you fall asleep faster. Consider making a quiet activity, such as reading, part of your nighttime  routine.  Try to avoid taking naps during the day so that you sleep better at night.  Get out of bed if you are still awake after 15 minutes of trying to sleep. Keep the lights down, but try reading or doing a quiet activity. When you feel sleepy, go back to bed. General instructions  Take over-the-counter and prescription medicines only as told by your health care provider.  Exercise regularly, as told by your health care provider. Avoid exercise starting several hours before bedtime.  Use relaxation techniques to manage stress. Ask your health care provider to suggest some techniques that may work well for you. These may include: ? Breathing exercises. ? Routines to release muscle tension. ? Visualizing peaceful scenes.  Make sure that you drive carefully. Avoid driving if you feel very sleepy.  Keep all follow-up visits as told by your health care provider. This is important. Contact a health care provider if:  You are tired throughout the day.  You have trouble in your daily routine due to sleepiness.  You continue to have sleep problems, or your sleep problems get worse. Get help right away if:  You have serious thoughts about hurting yourself or someone else. If you ever feel like you may hurt yourself or others, or have thoughts about taking your own life, get help right away. You can go to your nearest emergency department or call:  Your local emergency services (911 in the [place]  A suicide crisis helpline, such as the [facility] at (970)858-6920. This is open 24 hours a day. Summary  Insomnia is a sleep disorder that makes it difficult to fall asleep or stay asleep.  Insomnia can be long-term (chronic) or short-term (acute).  Treatment for insomnia depends on the cause. Treatment may focus on treating an underlying condition that is causing insomnia.  Keep a sleep diary to help you and your health care provider figure out what could be  causing your insomnia. This information is not intended to replace advice given to you by your health care provider. Make sure you discuss any questions you have with your health care provider. Document Released: 08/07/2000 Document Revised: 07/23/2017 Document Reviewed: 05/20/2017 Elsevier Patient Education  2020 [employer].  Budget-Friendly Healthy Eating There are many ways to save money at the grocery store and continue to eat healthy. You can be successful if you:  Plan meals according to your budget.  Make a grocery list and only purchase food according to your grocery list.  Prepare food yourself. What are tips for following this plan?  Reading food labels  Compare food labels between brand name foods and the store brand. Often the nutritional value is the same, but the store brand is lower cost.  Look for products that do not have added sugar, fat, or salt (sodium). These often cost the same but are healthier for you. Products may be labeled as: ? Sugar-free. ? Nonfat. ? Low-fat. ? Sodium-free. ? Low-sodium.  Look for lean ground beef labeled as at least 92% lean and 8% fat. Shopping  Buy only the items on your grocery list and go only to the areas of the store that have the items on your list.  Use coupons only for foods and brands you normally buy. Avoid buying items you wouldn't normally buy simply because they are on sale.  Check online and in newspapers for weekly deals.  Buy healthy items from the bulk bins when available, such as herbs, spices, flour, pasta, nuts, and dried fruit.  Buy fruits and vegetables that are in season. Prices are usually lower on in-season produce.  Look at the unit price on the price tag. Use it to compare different brands and sizes to find out which item is the best deal.  Choose healthy items that are often low-cost, such as carrots, potatoes, apples, bananas, and oranges. Dried or canned beans are a low-cost protein  source.  Buy in bulk and freeze extra food. Items you can buy in bulk include meats, fish, poultry, frozen fruits, and frozen vegetables.  Avoid buying "ready-to-eat" foods, such as pre-cut fruits and vegetables and pre-made salads.  If possible, shop around to discover where you can find the best prices. Consider other retailers such as dollar stores, larger [employer], local fruit and vegetable stands, and farmers markets.  Do not shop when you are hungry. If you shop while hungry, it may be hard to stick to your list and budget.  Resist impulse buying. Use your grocery list as your official plan for the week.  Buy a variety of vegetables and fruits by purchasing fresh, frozen, and canned items.  Look at the top and bottom shelves for deals. Foods at eye level (eye level of an adult or child) are usually more expensive.  Be efficient with your time when shopping. The more time you spend at the store, the more money you are likely to spend.  To save money when choosing more expensive foods like meats and dairy: ? Choose cheaper cuts of meat, such as bone-in chicken thighs and drumsticks instead of skinless and boneless chicken. When you are ready to prepare the chicken, you can remove the skin yourself to make it healthier. ? Choose lean meats like chicken or [place] instead of beef. ? Choose canned seafood, such as tuna, salmon, or sardines. ? Buy eggs as a low-cost source of protein. ? Buy dried beans and peas, such as lentils, split peas, or kidney beans instead of meats. Dried beans and peas are a good alternative source of protein. ? Buy the larger tubs of yogurt instead of individual-sized containers.  Choose water instead of sodas and other sweetened beverages.  Avoid buying chips, cookies, and other "junk food." These items are usually expensive and not healthy. Cooking  Make extra food and freeze the extras in meal-sized containers or in individual portions for fast  meals and snacks.  Pre-cook on days when you have extra time to prepare meals in advance. You can keep these meals in the fridge or freezer and reheat for a quick meal.  When you come home from the grocery store, wash, peel, and cut fruits and vegetables so they are ready to use and eat. This will help reduce food waste. Meal planning  Do not eat out or get fast food. Prepare food at home.  Make a grocery list and make sure to bring it with you to the store. If you have a smart phone, you could use your phone to create your shopping list.  Plan meals and snacks according to a grocery list and budget you create.  Use leftovers in your meal plan for the week.  Look for recipes where you can cook  once and make enough food for two meals.  Include budget-friendly meals like stews, casseroles, and stir-fry dishes.  Try some meatless meals or try "no cook" meals like salads.  Make sure that half your plate is filled with fruits or vegetables. Choose from fresh, frozen, or canned fruits and vegetables. If eating canned, remember to rinse them before eating. This will remove any excess salt added for packaging. Summary  Eating healthy on a budget is possible if you plan your meals according to your budget, purchase according to your budget and grocery list, and prepare food yourself.  Tips for buying more food on a limited budget include buying generic brands, using coupons only for foods you normally buy, and buying healthy items from the bulk bins when available.  Tips for buying cheaper food to replace expensive food include choosing cheaper, lean cuts of meat, and buying dried beans and peas. This information is not intended to replace advice given to you by your health care provider. Make sure you discuss any questions you have with your health care provider. Document Released: 04/13/2014 Document Revised: 08/11/2017 Document Reviewed: 08/11/2017 Elsevier Patient Education  2020 [place] Following a healthy eating pattern may help you to achieve and maintain a healthy body weight, reduce the risk of chronic disease, and live a long and productive life. It is important to follow a healthy eating pattern at an appropriate calorie level for your body. Your nutritional needs should be met primarily through food by choosing a variety of nutrient-rich foods. What are tips for following this plan? Reading food labels  Read labels and choose the following: ? Reduced or low sodium. ? Juices with 100% fruit juice. ? Foods with low saturated fats and high polyunsaturated and monounsaturated fats. ? Foods with whole grains, such as whole wheat, cracked wheat, brown rice, and wild rice. ? Whole grains that are fortified with folic acid. This is recommended for women who are pregnant or who want to become pregnant.  Read labels and avoid the following: ? Foods with a lot of added sugars. These include foods that contain brown sugar, corn sweetener, corn syrup, dextrose, fructose, glucose, high-fructose corn syrup, honey, invert sugar, lactose, malt syrup, maltose, molasses, raw sugar, sucrose, trehalose, or turbinado sugar.  Do not eat more than the following amounts of added sugar per day:  6 teaspoons (25 g) for women.  9 teaspoons (38 g) for men. ? Foods that contain processed or refined starches and grains. ? Refined grain products, such as white flour, degermed cornmeal, white bread, and white rice. Shopping  Choose nutrient-rich snacks, such as vegetables, whole fruits, and nuts. Avoid high-calorie and high-sugar snacks, such as potato chips, fruit snacks, and candy.  Use oil-based dressings and spreads on foods instead of solid fats such as butter, stick margarine, or cream cheese.  Limit pre-made sauces, mixes, and "instant" products such as flavored rice, instant noodles, and ready-made pasta.  Try more plant-protein sources, such as tofu, tempeh,  black beans, edamame, lentils, nuts, and seeds.  Explore eating plans such as the Mediterranean diet or vegetarian diet. Cooking  Use oil to saut or stir-fry foods instead of solid fats such as butter, stick margarine, or lard.  Try baking, boiling, grilling, or broiling instead of frying.  Remove the fatty part of meats before cooking.  Steam vegetables in water or broth. Meal planning   At meals, imagine dividing your plate into fourths: ? One-half of your plate is  fruits and vegetables. ? One-fourth of your plate is whole grains. ? One-fourth of your plate is protein, especially lean meats, poultry, eggs, tofu, beans, or nuts.  Include low-fat dairy as part of your daily diet. Lifestyle  Choose healthy options in all settings, including home, work, school, restaurants, or stores.  Prepare your food safely: ? Wash your hands after handling raw meats. ? Keep food preparation surfaces clean by regularly washing with hot, soapy water. ? Keep raw meats separate from ready-to-eat foods, such as fruits and vegetables. ? Cook seafood, meat, poultry, and eggs to the recommended internal temperature. ? Store foods at safe temperatures. In general:  Keep cold foods at 2F (4.4C) or below.  Keep hot foods at 12F (60C) or above.  Keep your freezer at [facility] (-17.8C) or below.  Foods are no longer safe to eat when they have been between the temperatures of 40-12F (4.4-60C) for more than 2 hours. What foods should I eat? Fruits Aim to eat 2 cup-equivalents of fresh, canned (in natural juice), or frozen fruits each day. Examples of 1 cup-equivalent of fruit include 1 small apple, 8 large strawberries, 1 cup canned fruit,  cup dried fruit, or 1 cup 100% juice. Vegetables Aim to eat 2-3 cup-equivalents of fresh and frozen vegetables each day, including different varieties and colors. Examples of 1 cup-equivalent of vegetables include 2 medium carrots, 2 cups raw, leafy greens, 1  cup chopped vegetable (raw or cooked), or 1 medium baked potato. Grains Aim to eat 6 ounce-equivalents of whole grains each day. Examples of 1 ounce-equivalent of grains include 1 slice of bread, 1 cup ready-to-eat cereal, 3 cups popcorn, or  cup cooked rice, pasta, or cereal. Meats and other proteins Aim to eat 5-6 ounce-equivalents of protein each day. Examples of 1 ounce-equivalent of protein include 1 egg, 1/2 cup nuts or seeds, or 1 tablespoon (16 g) peanut butter. A cut of meat or fish that is the size of a deck of cards is about 3-4 ounce-equivalents.  Of the protein you eat each week, try to have at least 8 ounces come from seafood. This includes salmon, trout, herring, and anchovies. Dairy Aim to eat 3 cup-equivalents of fat-free or low-fat dairy each day. Examples of 1 cup-equivalent of dairy include 1 cup (240 mL) milk, 8 ounces (250 g) yogurt, 1 ounces (44 g) natural cheese, or 1 cup (240 mL) fortified soy milk. Fats and oils  Aim for about 5 teaspoons (21 g) per day. Choose monounsaturated fats, such as canola and olive oils, avocados, peanut butter, and most nuts, or polyunsaturated fats, such as sunflower, corn, and soybean oils, walnuts, pine nuts, sesame seeds, sunflower seeds, and flaxseed. Beverages  Aim for six 8-oz glasses of water per day. Limit coffee to three to five 8-oz cups per day.  Limit caffeinated beverages that have added calories, such as soda and energy drinks.  Limit alcohol intake to no more than 1 drink a day for nonpregnant women and 2 drinks a day for men. One drink equals 12 oz of beer (355 mL), 5 oz of wine (148 mL), or 1 oz of hard liquor (44 mL). Seasoning and other foods  Avoid adding excess amounts of salt to your foods. Try flavoring foods with herbs and spices instead of salt.  Avoid adding sugar to foods.  Try using oil-based dressings, sauces, and spreads instead of solid fats. This information is based on general [place] nutrition  guidelines. For more information, visit [employer]. Exact amounts  may vary based on your nutrition needs. Summary  A healthy eating plan may help you to maintain a healthy weight, reduce the risk of chronic diseases, and stay active throughout your life.  Plan your meals. Make sure you eat the right portions of a variety of nutrient-rich foods.  Try baking, boiling, grilling, or broiling instead of frying.  Choose healthy options in all settings, including home, work, school, restaurants, or stores. This information is not intended to replace advice given to you by your health care provider. Make sure you discuss any questions you have with your health care provider. Document Released: 11/22/2017 Document Revised: 11/22/2017 Document Reviewed: 11/22/2017 Elsevier Patient Education  [place].  Breast Tenderness Breast tenderness is a common problem for women of all ages. Breast tenderness may cause mild discomfort to severe pain. The pain usually comes and goes in association with your menstrual cycle, but it can be constant. Breast tenderness has many possible causes, including hormone changes and some medicines. Your health care provider may order tests, such as a mammogram or an ultrasound, to check for any unusual findings. Having breast tenderness usually does not mean that you have breast cancer. Follow these instructions at home: Sometimes, reassurance that you do not have breast cancer is all that is needed. In general, follow these home care instructions: Managing pain and discomfort   If directed, apply ice to the area: ? Put ice in a plastic bag. ? Place a towel between your skin and the bag. ? Leave the ice on for 20 minutes, 2-3 times a day.  Make sure you are wearing a supportive bra, especially during exercise. You may also want to wear a supportive bra while sleeping if your breasts are very tender. Medicines  Take over-the-counter and prescription  medicines only as told by your health care provider. If the cause of your pain is infection, you may be prescribed an antibiotic medicine.  If you were prescribed an antibiotic, take it as told by your health care provider. Do not stop taking the antibiotic even if you start to feel better. General instructions   Your health care provider may recommend that you reduce the amount of fat in your diet. You can do this by: ? Limiting fried foods. ? Cooking foods using methods, such as baking, boiling, grilling, and broiling.  Decrease the amount of caffeine in your diet. You can do this by drinking more water and choosing caffeine-free options.  Keep a log of the days and times when your breasts are most tender.  Ask your health care provider how to do breast exams at home. This will help you notice if you have an unusual growth or lump. Contact a health care provider if:  Any part of your breast is hard, red, and hot to the touch. This may be a sign of infection.  You are not breastfeeding and you have fluid, especially blood or pus, coming out of your nipples.  You have a fever.  You have a new or painful lump in your breast that remains after your menstrual period ends.  Your pain does not improve or it gets worse.  Your pain is interfering with your daily activities. This information is not intended to replace advice given to you by your health care provider. Make sure you discuss any questions you have with your health care provider. Document Released: 07/23/2008 Document Revised: 07/23/2017 Document Reviewed: 05/08/2016 Elsevier Patient Education  2020 [place].  Citalopram tablets What is  this medicine? CITALOPRAM (sye TAL oh pram) is a medicine for depression. This medicine may be used for other purposes; ask your health care provider or pharmacist if you have questions. COMMON BRAND NAME(S): Celexa What should I tell my health care provider before I take this  medicine? They need to know if you have any of these conditions:  bleeding disorders  bipolar disorder or a family history of bipolar disorder  glaucoma  heart disease  history of irregular heartbeat  kidney disease  liver disease  low levels of magnesium or potassium in the blood  receiving electroconvulsive therapy  seizures  suicidal thoughts, plans, or attempt; a previous suicide attempt by you or a family member  take medicines that treat or prevent blood clots  thyroid disease  an unusual or allergic reaction to citalopram, escitalopram, other medicines, foods, dyes, or preservatives  pregnant or trying to become pregnant  breast-feeding How should I use this medicine? Take this medicine by mouth with a glass of water. Follow the directions on the prescription label. You can take it with or without food. Take your medicine at regular intervals. Do not take your medicine more often than directed. Do not stop taking this medicine suddenly except upon the advice of your doctor. Stopping this medicine too quickly may cause serious side effects or your condition may worsen. A special MedGuide will be given to you by the pharmacist with each prescription and refill. Be sure to read this information carefully each time. Talk to your pediatrician regarding the use of this medicine in children. Special care may be needed. Patients over 79 years old may have a stronger reaction and need a smaller dose. Overdosage: If you think you have taken too much of this medicine contact a poison control center or emergency room at once. NOTE: This medicine is only for you. Do not share this medicine with others. What if I miss a dose? If you miss a dose, take it as soon as you can. If it is almost time for your next dose, take only that dose. Do not take double or extra doses. What may interact with this medicine? Do not take this medicine with any of the following medications:  certain  medicines for fungal infections like fluconazole, itraconazole, ketoconazole, posaconazole, voriconazole  cisapride  dronedarone  escitalopram  linezolid  MAOIs like Carbex, Eldepryl, Marplan, Nardil, and Parnate  methylene blue (injected into a vein)  pimozide  thioridazine This medicine may also interact with the following medications:  alcohol  amphetamines  aspirin and aspirin-like medicines  carbamazepine  certain medicines for depression, anxiety, or psychotic disturbances  certain medicines for infections like chloroquine, clarithromycin, erythromycin, furazolidone, isoniazid, pentamidine  certain medicines for migraine headaches like almotriptan, eletriptan, frovatriptan, naratriptan, rizatriptan, sumatriptan, zolmitriptan  certain medicines for sleep  certain medicines that treat or prevent blood clots like dalteparin, enoxaparin, warfarin  cimetidine  diuretics  dofetilide  fentanyl  lithium  methadone  metoprolol  NSAIDs, medicines for pain and inflammation, like ibuprofen or naproxen  omeprazole  other medicines that prolong the QT interval (cause an abnormal heart rhythm)  procarbazine  rasagiline  supplements like St. John's wort, kava kava, valerian  tramadol  tryptophan  ziprasidone This list may not describe all possible interactions. Give your health care provider a list of all the medicines, herbs, non-prescription drugs, or dietary supplements you use. Also tell them if you smoke, drink alcohol, or use illegal drugs. Some items may interact with your medicine. What  should I watch for while using this medicine? Tell your doctor if your symptoms do not get better or if they get worse. Visit your doctor or health care professional for regular checks on your progress. Because it may take several weeks to see the full effects of this medicine, it is important to continue your treatment as prescribed by your doctor. Patients and  their families should watch out for new or worsening thoughts of suicide or depression. Also watch out for sudden changes in feelings such as feeling anxious, agitated, panicky, irritable, hostile, aggressive, impulsive, severely restless, overly excited and hyperactive, or not being able to sleep. If this happens, especially at the beginning of treatment or after a change in dose, call your health care professional. Dennis Bast may get drowsy or dizzy. Do not drive, use machinery, or do anything that needs mental alertness until you know how this medicine affects you. Do not stand or sit up quickly, especially if you are an older patient. This reduces the risk of dizzy or fainting spells. Alcohol may interfere with the effect of this medicine. Avoid alcoholic drinks. Your mouth may get dry. Chewing sugarless gum or sucking hard candy, and drinking plenty of water will help. Contact your doctor if the problem does not go away or is severe. What side effects may I notice from receiving this medicine? Side effects that you should report to your doctor or health care professional as soon as possible:  allergic reactions like skin rash, itching or hives, swelling of the face, lips, or tongue  anxious  black, tarry stools  breathing problems  changes in vision  chest pain  confusion  elevated mood, decreased need for sleep, racing thoughts, impulsive behavior  eye pain  fast, irregular heartbeat  feeling faint or lightheaded, falls  feeling agitated, angry, or irritable  hallucination, loss of contact with reality  loss of balance or coordination  loss of memory  painful or prolonged erections  restlessness, pacing, inability to keep still  seizures  stiff muscles  suicidal thoughts or other mood changes  trouble sleeping  unusual bleeding or bruising  unusually weak or tired  vomiting Side effects that usually do not require medical attention (report to your doctor or health  care professional if they continue or are bothersome):  change in appetite or weight  change in sex drive or performance  dizziness  headache  increased sweating  indigestion, nausea  tremors This list may not describe all possible side effects. Call your doctor for medical advice about side effects. You may report side effects to FDA at 1-800-FDA-1088. Where should I keep my medicine? Keep out of reach of children. Store at room temperature between 15 and 30 degrees C (59 and 86 degrees F). Throw away any unused medicine after the expiration date. NOTE: This sheet is a summary. It may not cover all possible information. If you have questions about this medicine, talk to your doctor, pharmacist, or health care provider.  2020 Elsevier/Gold Standard (2018-08-01 09:05:36)  Mindfulness-Based Stress Reduction Mindfulness-based stress reduction (MBSR) is a program that helps people learn to practice mindfulness. Mindfulness is the practice of intentionally paying attention to the present moment. It can be learned and practiced through techniques such as education, breathing exercises, meditation, and yoga. MBSR includes several mindfulness techniques in one program. MBSR works best when you understand the treatment, are willing to try new things, and can commit to spending time practicing what you learn. MBSR training may include learning about:  How your emotions, thoughts, and reactions affect your body.  New ways to respond to things that cause negative thoughts to start (triggers).  How to notice your thoughts and let go of them.  Practicing awareness of everyday things that you normally do without thinking.  The techniques and goals of different types of meditation. What are the benefits of MBSR? MBSR can have many benefits, which include helping you to:  Develop self-awareness. This refers to knowing and understanding yourself.  Learn skills and attitudes that help you to  participate in your own health care.  Learn new ways to care for yourself.  Be more accepting about how things are, and let things go.  Be less judgmental and approach things with an open mind.  Be patient with yourself and trust yourself more. MBSR has also been shown to:  Reduce negative emotions, such as depression and anxiety.  Improve memory and focus.  Change how you sense and approach pain.  Boost your body's ability to fight infections.  Help you connect better with other people.  Improve your sense of well-being. Follow these instructions at home:   Find a local in-person or online MBSR program.  Set aside some time regularly for mindfulness practice.  Find a mindfulness practice that works best for you. This may include one or more of the following: ? Meditation. Meditation involves focusing your mind on a certain thought or activity. ? Breathing awareness exercises. These help you to stay present by focusing on your breath. ? Body scan. For this practice, you lie down and pay attention to each part of your body from head to toe. You can identify tension and soreness and intentionally relax parts of your body. ? Yoga. Yoga involves stretching and breathing, and it can improve your ability to move and be flexible. It can also provide an experience of testing your body's limits, which can help you release stress. ? Mindful eating. This way of eating involves focusing on the taste, texture, color, and smell of each bite of food. Because this slows down eating and helps you feel full sooner, it can be an important part of a weight-loss plan.  Find a podcast or recording that provides guidance for breathing awareness, body scan, or meditation exercises. You can listen to these any time when you have a free moment to rest without distractions.  Follow your treatment plan as told by your health care provider. This may include taking regular medicines and making changes to  your diet or lifestyle as recommended. How to practice mindfulness To do a basic awareness exercise:  Find a comfortable place to sit.  Pay attention to the present moment. Observe your thoughts, feelings, and surroundings just as they are.  Avoid placing judgment on yourself, your feelings, or your surroundings. Make note of any judgment that comes up, and let it go.  Your mind may wander, and that is okay. Make note of when your thoughts drift, and return your attention to the present moment. To do basic mindfulness meditation:  Find a comfortable place to sit. This may include a stable chair or a firm floor cushion. ? Sit upright with your back straight. Let your arms fall next to your side with your hands resting on your legs. ? If sitting in a chair, rest your feet flat on the floor. ? If sitting on a cushion, cross your legs in front of you.  Keep your head in a neutral position with your chin dropped slightly. Relax  your jaw and rest the tip of your tongue on the roof of your mouth. Drop your gaze to the floor. You can close your eyes if you like.  Breathe normally and pay attention to your breath. Feel the air moving in and out of your nose. Feel your belly expanding and relaxing with each breath.  Your mind may wander, and that is okay. Make note of when your thoughts drift, and return your attention to your breath.  Avoid placing judgment on yourself, your feelings, or your surroundings. Make note of any judgment or feelings that come up, let them go, and bring your attention back to your breath.  When you are ready, lift your gaze or open your eyes. Pay attention to how your body feels after the meditation. Where to find more information You can find more information about MBSR from:  Your health care provider.  Community-based meditation centers or programs.  Programs offered near you. Summary  Mindfulness-based stress reduction (MBSR) is a program that teaches you  how to intentionally pay attention to the present moment. It is used with other treatments to help you cope better with daily stress, emotions, and pain.  MBSR focuses on developing self-awareness, which allows you to respond to life stress without judgment or negative emotions.  MBSR programs may involve learning different mindfulness practices, such as breathing exercises, meditation, yoga, body scan, or mindful eating. Find a mindfulness practice that works best for you, and set aside time for it on a regular basis. This information is not intended to replace advice given to you by your health care provider. Make sure you discuss any questions you have with your health care provider. Document Released: 12/17/2016 Document Revised: 07/23/2017 Document Reviewed: 12/17/2016 Elsevier Patient Education  2020 [employer]  After being diagnosed with an anxiety disorder, you may be relieved to know why you have felt or behaved a certain way. It is natural to also feel overwhelmed about the treatment ahead and what it will mean for your life. With care and support, you can manage this condition and recover from it. How to cope with anxiety Dealing with stress Stress is your bodys reaction to life changes and events, both good and bad. Stress can last just a few hours or it can be ongoing. Stress can play a major role in anxiety, so it is important to learn both how to cope with stress and how to think about it differently. Talk with your health care provider or a counselor to learn more about stress reduction. He or she may suggest some stress reduction techniques, such as:  Music therapy. This can include creating or listening to music that you enjoy and that inspires you.  Mindfulness-based meditation. This involves being aware of your normal breaths, rather than trying to control your breathing. It can be done while sitting or walking.  Centering prayer. This is a kind of  meditation that involves focusing on a word, phrase, or sacred image that is meaningful to you and that brings you peace.  Deep breathing. To do this, expand your stomach and inhale slowly through your nose. Hold your breath for 3-5 seconds. Then exhale slowly, allowing your stomach muscles to relax.  Self-talk. This is a skill where you identify thought patterns that lead to anxiety reactions and correct those thoughts.  Muscle relaxation. This involves tensing muscles then relaxing them. Choose a stress reduction technique that fits your lifestyle and personality. Stress reduction techniques take  time and practice. Set aside 5-15 minutes a day to do them. Therapists can offer training in these techniques. The training may be covered by some insurance plans. Other things you can do to manage stress include:  Keeping a stress diary. This can help you learn what triggers your stress and ways to control your response.  Thinking about how you respond to certain situations. You may not be able to control everything, but you can control your reaction.  Making time for activities that help you relax, and not feeling guilty about spending your time in this way. Therapy combined with coping and stress-reduction skills provides the best chance for successful treatment. Medicines Medicines can help ease symptoms. Medicines for anxiety include:  Anti-anxiety drugs.  Antidepressants.  Beta-blockers. Medicines may be used as the main treatment for anxiety disorder, along with therapy, or if other treatments are not working. Medicines should be prescribed by a health care provider. Relationships Relationships can play a big part in helping you recover. Try to spend more time connecting with trusted friends and family members. Consider going to couples counseling, taking family education classes, or going to family therapy. Therapy can help you and others better understand the condition. How to recognize  changes in your condition Everyone has a different response to treatment for anxiety. Recovery from anxiety happens when symptoms decrease and stop interfering with your daily activities at home or work. This may mean that you will start to:  Have better concentration and focus.  Sleep better.  Be less irritable.  Have more energy.  Have improved memory. It is important to recognize when your condition is getting worse. Contact your health care provider if your symptoms interfere with home or work and you do not feel like your condition is improving. Where to find help and support: You can get help and support from these sources:  Self-help groups.  Online and [employer].  A trusted spiritual leader.  Couples counseling.  Family education classes.  Family therapy. Follow these instructions at home:  Eat a healthy diet that includes plenty of vegetables, fruits, whole grains, low-fat dairy products, and lean protein. Do not eat a lot of foods that are high in solid fats, added sugars, or salt.  Exercise. Most adults should do the following: ? Exercise for at least 150 minutes each week. The exercise should increase your heart rate and make you sweat (moderate-intensity exercise). ? Strengthening exercises at least twice a week.  Cut down on caffeine, tobacco, alcohol, and other potentially harmful substances.  Get the right amount and quality of sleep. Most adults need 7-9 hours of sleep each night.  Make choices that simplify your life.  Take over-the-counter and prescription medicines only as told by your health care provider.  Avoid caffeine, alcohol, and certain over-the-counter cold medicines. These may make you feel worse. Ask your pharmacist which medicines to avoid.  Keep all follow-up visits as told by your health care provider. This is important. Questions to ask your health care provider  Would I benefit from therapy?  How often should I follow  up with a health care provider?  How long do I need to take medicine?  Are there any long-term side effects of my medicine?  Are there any alternatives to taking medicine? Contact a health care provider if:  You have a hard time staying focused or finishing daily tasks.  You spend many hours a day feeling worried about everyday life.  You become exhausted by worry.  You start to have headaches, feel tense, or have nausea.  You urinate more than normal.  You have diarrhea. Get help right away if:  You have a racing heart and shortness of breath.  You have thoughts of hurting yourself or others. If you ever feel like you may hurt yourself or others, or have thoughts about taking your own life, get help right away. You can go to your nearest emergency department or call:  Your local emergency services (911 in the [place]  A suicide crisis helpline, such as the [facility] at 615-240-6330. This is open 24-hours a day. Summary  Taking steps to deal with stress can help calm you.  Medicines cannot cure anxiety disorders, but they can help ease symptoms.  Family, friends, and partners can play a big part in helping you recover from an anxiety disorder. This information is not intended to replace advice given to you by your health care provider. Make sure you discuss any questions you have with your health care provider. Document Released: 08/04/2016 Document Revised: 07/23/2017 Document Reviewed: 08/04/2016 Elsevier Patient Education  [place] Disorder Binge-eating disorder is a problem that involves repeated episodes of binge-eating. Binge-eating refers to eating a larger-than-normal amount of food in a short period of time, usually within 2 hours. People with this condition may eat even when they are not hungry, and they do not stop eating even when they feel full. People with binge-eating disorder feel unable to control their  eating. Although they feel bad about overeating, they usually do not try to undo the bingeing by using laxatives or making themselves vomit. They do not starve themselves or exercise too much. Binge-eating disorder usually starts in the teenage years or early 68s. It often gets worse with stress. What are the causes? The cause of this condition is not known. What increases the risk? The following factors may make you more likely to develop this condition:  Being a teenager or in your early 68s.  Being female. Binge-eating disorder can affect males, but it is more common in females.  Being overweight or obese.  Having a mental health disorder, such as depression or anxiety.  Having a substance use disorder, such as alcohol use disorder.  Having a history of unhealthy dieting, such as meal skipping, yo-yo dieting, food restricting, or avoiding certain kinds of foods. What are the signs or symptoms? Symptoms of this condition include:  Eating much more quickly than normal.  Eating to the point of feeling physically uncomfortable.  Eating large amounts of food when you are not hungry.  Eating alone because you are embarrassed about how much you are eating.  Feeling disgusted, depressed, or guilty after overeating. How is this diagnosed? This condition is diagnosed through an assessment by your health care provider. You may be diagnosed with the disorder if you:  Binge-eat an average of one or more times a week for three months or longer.  Have three or more of the symptoms of the disorder. Once you have been diagnosed, your level of binge-eating disorder will be rated from mild to severe. The rating is based on how often you binge-eat. How is this treated? This condition may be treated with:  Cognitive behavioral therapy (CBT). This is a form of talk therapy that helps you recognize the thoughts, beliefs, and emotions that contribute to overeating. It also helps you change  them.  Interpersonal psychotherapy. This is a form of talk therapy that focuses on  fixing relationship problems that trigger binge-eating episodes.  Dialectical behavioral therapy (DBT). This is a form of talk therapy that helps you learn skills to control your emotions and tolerate distress without binge-eating.  Medicine.  Weight-loss programs. These can be important if you are overweight. Losing excess weight can improve your physical health and the way you feel about yourself. Treatment is usually provided by mental health professionals, such as psychologists, psychiatrists, licensed professional counselors, and clinical social workers. Follow these instructions at home: Lifestyle      Eat a healthy diet that consists of lean meats and low-fat dairy products, as well as foods that are high in fiber, such as fresh fruits and vegetables, whole grains, and beans.  Work to develop a healthy relationship with food. Talk with your health care provider or a nutrition specialist (dietitian). He or she can provide guidance about healthy eating and healthy lifestyle choices.  Start an exercise routine and stay active. Aim for 30 or more minutes of exercise a day on 5 or more days a week to keep your body strong and healthy. You may need to exercise more if you want to lose weight. Talk with your health care provider about how much and what type of exercise you can do. Some ways to be active include: ? Playing sports. ? Biking. ? Skating or skateboarding. ? Dancing. ? Running, walking, jogging, or hiking. ? Doing yard work. General instructions  Take over-the-counter and prescription medicines only as told by your health care provider.  Drink enough fluid to keep your urine pale yellow.  Keep all follow-up visits as told by your health care provider. This is important. Where to find more information National Eating Disorders Association (NEDA): www.nationaleatingdisorders.org Contact a  health care provider if:  Your symptoms get worse.  You start having new symptoms.  You start compensating for eating binges with harmful behaviors, such as: ? Making yourself vomit. ? Exercising too much. ? Using laxatives. Get help right away if:  You have serious thoughts about hurting yourself or someone else. If you ever feel like you may hurt yourself or others, or have thoughts about taking your own life, get help right away. You can go to your nearest emergency department or call:  Your local emergency services (911 in the [place]  A suicide crisis helpline, such as the [facility] at 937-745-0397. This is open 24 hours a day. Summary  You may have binge-eating disorder if you have feelings of guilt from overeating, eat to the point of feeling uncomfortable, eat a large amount of food in a short time, or find yourself eating when you are not hungry. Seek help from your health care provider.  The exact cause of a binge-eating disorder is not known. There are some risk factors for this disease, such as having a mental health disorder and having a history of unhealthy dieting.  There are a variety of treatment options such as counseling therapy, medicines, and learning healthy ways to lose or maintain your weight. These can help you overcome your binge-eating disorder. This information is not intended to replace advice given to you by your health care provider. Make sure you discuss any questions you have with your health care provider. Document Released: 12/31/2010 Document Revised: 11/30/2018 Document Reviewed: 05/10/2017 Elsevier Patient Education  2020 [employer].

## 2019-08-03 NOTE — Progress Notes (Signed)
Virtual Visit via Video Note  I connected with Sylvia Townsend  on 08/03/19 at  4:00 PM EST by a video enabled telemedicine application and verified that I am speaking with the correct person using two identifiers.  Location patient: home Location provider:work or home office Persons participating in the virtual visit: patient, provider, pts mom and kids  I discussed the limitations of evaluation and management by telemedicine and the availability of in person appointments. The patient expressed understanding and agreed to proceed.   HPI:  1. Anxiety>depression with GAD 7 score 13 today and PHQ 9 score 6 worsening over the last 2 years with increased task re : her mom, the kids, putting everyone before herself. Even her sister rec she seek help. She is binge eating at times and sleeping less 6-7 hours staying up to 12 to 1 am some nights of 3 am rarely to have some alone time or get tasks done  2. Asthma needs refill of albuterol inhaler she is coughing on video today  3. B/l breast pain was left breast now right near nipple she is no longer breast feeding and has shooting pain at times she just finished her menses and had more breast pain on the right than the left side of her breast but also has pains on the left.   ROS: See pertinent positives and negatives per HPI.  Past Medical History:  Diagnosis Date  . Abnormal Pap smear 2004,2005   Hx of.  . Acid reflux   . Asthma    Induced w/ weather changes;inhaler prn  . Migraines 2012  . Seizure ([place])    x 2 as a Toddler    Past Surgical History:  Procedure Laterality Date  . WISDOM TOOTH EXTRACTION  2007   all 4 removed    Family History  Problem Relation Age of Onset  . Stroke Maternal Grandfather   . Hypertension Maternal Grandfather   . Cancer Paternal Uncle        Throat  . Cancer Paternal Grandmother        Unsure which type  . Migraines Father   . Other Father        cardiac  amyloidosis  . Asthma Sister    x 2  . Asthma Cousin        Maternal  . Asthma Other        Nephew  . Kidney failure Maternal Uncle   . Lupus Sister   . Diverticulitis Mother   . Stroke Maternal Grandmother   . Cancer Paternal Uncle        stomach cancer     SOCIAL HX:  Married with children 2 children  Mom lives with her  Sister living with her x 2.5 weeks as of 08/03/19    Current Outpatient Medications:  .  acetaminophen (TYLENOL) 500 MG tablet, Take 1,000 mg by mouth every 6 (six) hours as needed for moderate pain or headache., Disp: , Rfl:  .  ibuprofen (ADVIL,MOTRIN) 600 MG tablet, Take 1 tablet (600 mg total) by mouth every 6 (six) hours., Disp: 30 tablet, Rfl: 0 .  Iron-FA-B Cmp-C-Biot-Probiotic (FUSION PLUS) CAPS, Take 1 capsule by mouth daily., Disp: , Rfl: 4 .  Multiple Vitamin (MULTIVITAMIN) tablet, Take 1 tablet by mouth daily., Disp: , Rfl:  .  albuterol (VENTOLIN HFA) 108 (90 Base) MCG/ACT inhaler, Inhale 1-2 puffs into the lungs every 4 (four) hours as needed for wheezing or shortness of breath. Rescue inhaler, Disp: 18 g, Rfl:  12  EXAM:  VITALS per patient if applicable:  GENERAL: alert, oriented, appears well and in no acute distress  HEENT: atraumatic, conjunttiva clear, no obvious abnormalities on inspection of external nose and ears  NECK: normal movements of the head and neck  LUNGS: on inspection no signs of respiratory distress, breathing rate appears normal, no obvious gross SOB, gasping or wheezing  CV: no obvious cyanosis  MS: moves all visible extremities without noticeable abnormality  PSYCH/NEURO: pleasant and cooperative, no obvious depression or anxiety, speech and thought processing grossly intact  ASSESSMENT AND PLAN:  Discussed the following assessment and plan:  Anxiety and depression PHQ 9 score 6 today and GAD 7 score 3 Referred to SEL group  Disc L theanine/tranquil sleep  Disc increase sleep  Given info meditation apps Consider ssri I.e celexa in the  future   Breast pain - Plan: MM DIAG BREAST TOMO BILATERAL, US BREAST LTD UNI RIGHT INC AXILLA, US BREAST LTD UNI LEFT INC AXILLA GI breast center  Given info could be related menses r/o other etiology   Binge eating -given info healthy diet choices  -need discipline, determination, dedication and self control to avoid  -referral to therapy SEL group   ashtma -prn albuterol  Will assess to see if needs inhaler steroid I.e flovent, symbicort  HM-bill physical at f/u  Flu shotdue 2020 will get in [facility] per pt as of 08/03/2019   Tdap up to datenotes state had 05/27/17 and possibly 12/26/12  ob/gyn  pna 25 had 5/5/14consider repeat with asthma hx  Immune hep Band MMR  Pap had 02/2016 request records [facility] Dr. Benjie Karvonen -pap 04/20/18 neg neg HPV Saw ob/gyn 04/19/18 Dr. Alesia Richards did STD check and pap no results in notes. -requested pap  Saw [facility] Dr. Alesia Richards 05/16/19  H/o anxiety and binge eating rec disc with PCP and checked STDS CT/NG/TV/HIV/HBsAg/RPR/HCV and yeast vaginitis   -we discussed possible serious and likely etiologies, options for evaluation and workup, limitations of telemedicine visit vs in person visit, treatment, treatment risks and precautions. Pt prefers to treat via telemedicine empirically rather then risking or undertaking an in person visit at this moment. Patient agrees to seek prompt in person care if worsening, new symptoms arise, or if is not improving with treatment.   I discussed the assessment and treatment plan with the patient. The patient was provided an opportunity to ask questions and all were answered. The patient agreed with the plan and demonstrated an understanding of the instructions.   The patient was advised to call back or seek an in-person evaluation if the symptoms worsen or if the condition fails to improve as anticipated.  Time spent 25 minutes  Delorise Jackson, MD

## 2019-08-07 ENCOUNTER — Other Ambulatory Visit: Payer: Self-pay | Admitting: Internal Medicine

## 2019-08-07 DIAGNOSIS — J4520 Mild intermittent asthma, uncomplicated: Secondary | ICD-10-CM

## 2019-08-07 MED ORDER — MONTELUKAST SODIUM 10 MG PO TABS: 10.0000 mg | ORAL_TABLET | Freq: Every day | ORAL | 3 refills | Status: DC

## 2019-08-08 ENCOUNTER — Encounter

## 2019-08-08 ENCOUNTER — Other Ambulatory Visit

## 2019-08-14 ENCOUNTER — Ambulatory Visit

## 2019-08-14 ENCOUNTER — Other Ambulatory Visit: Payer: Self-pay

## 2019-08-14 ENCOUNTER — Ambulatory Visit
Admission: RE | Admit: 2019-08-14 | Discharge: 2019-08-14 | Disposition: A | Discharge status: Home / Self Care | Source: Ambulatory Visit | Attending: Internal Medicine | Admitting: Internal Medicine

## 2019-08-14 DIAGNOSIS — N644 Mastodynia: Secondary | ICD-10-CM

## 2019-09-01 NOTE — Progress Notes (Signed)
This encounter was created in error - please disregard.

## 2019-09-05 NOTE — Progress Notes (Signed)
This encounter was created in error - please disregard.

## 2019-09-07 ENCOUNTER — Other Ambulatory Visit: Payer: Self-pay

## 2019-09-07 ENCOUNTER — Ambulatory Visit: Admitting: Genetic Counselor

## 2019-09-18 NOTE — Progress Notes (Signed)
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  I informed her that most patients with TTR cardiac amyloidosis develop cardiac symptoms after the age of 60. I explained to her that the V142I TTR (formerly known as V122I) pathogenic variant has been reported in several patients with cardiac amyloidosis and is more prevalent amongst African Americans. About 3% to 3.9% of African Americans are heterozygous for V142I pathogenic variant. Individuals with this pathogenic variant are known to have heart failure in their 70s. She verbalized understanding of this. I explained to her that a mutation in TTR gene destabilizes the protein causing it to misfold and accumulate in the cardiac tissue. Treatment options are available to stabilize the TTR tetramer and were also briefly reviewed. We also discussed the genetics of hereditary transthyretin amyloidosis (HTA), namely inheritance, incomplete penetrance and variable expression that is seen in patients with cardiac amyloidosis due to mutations in TTR gene.   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 She has not yet been evaluated for cardiac amyloidosis.  Family History Their 4-generation family history was obtained. Sylvia Townsend's father (II.5) began experiencing shortness of breath since the age of 5. He underwent cardiac MRI and other tests that led to the diagnosis of hereditary transthyretin amyloidosis. In November 2020 he had a pacemaker implanted and feels much better. She informs me  that her dad would complain of numbness in his fingers, especially while driving for the last 20 years. She is not aware of any neurological issues or other issues that relate to HTA.  Sylvia Townsend has a son, age 74 (IV.1) and a daughter age 66 (IV.2). She has a younger sister (III.2), age 67. Her father's siblings (II.1-II.4) passed away from cancer and paternal grandmother (I.2) died of old age. She is unaware of her paternal grandfather's (I.1) medical history.  Sylvia Townsend's mother (II.6) is 52 and has early Alzheimer's disease. She has two living sisters (II.7-II.8) and a living brother (II.11). Two other brothers (II.9, II.10) are now deceased. Her maternal grandfather (I.3) had multiple strokes and died of heart disease at age 50; maternal grandmother (I.4) had dementia and died in her 56s.  Impressions Since this is an autosomal dominant condition, Sylvia Townsend and her sister are at a 50% risk of inheriting the pathogenic variant in [place] (V142I) from their father. She is interested in getting tested for this variant. I informed her that genetic testing for the familial pathogenic variant is recommended to clarify her risk for cardiac amyloidosis so as to reduce morbidity and mortality by early diagnosis and treatment. This will help direct appropriate surveillance, treatment and subsequent management of cardiac amyloidosis.   We also discussed the protections afforded by the Genetic Information Non-Discrimination Act (GINA). I explained to her that GINA protects her from losing employment or health insurance. However, these protections are not extended to life insurance and disability. I recommended that she obtain appropriate life insurance coverage before she gets tested. She verbalized understanding of this.  Plans Blood was drawn today and testing for the familial pathogenic variant in TTR (V142I) will be performed after a copy of her  dad's test report is received to confirm the identity of the pathogenic  variant.   Sylvia Townsend, Ph.D, [facility] Clinical Molecular Geneticist

## 2019-11-29 ENCOUNTER — Other Ambulatory Visit: Payer: Self-pay

## 2019-11-29 ENCOUNTER — Other Ambulatory Visit (INDEPENDENT_AMBULATORY_CARE_PROVIDER_SITE_OTHER)

## 2019-11-29 DIAGNOSIS — E785 Hyperlipidemia, unspecified: Secondary | ICD-10-CM

## 2019-11-29 DIAGNOSIS — Z0000 Encounter for general adult medical examination without abnormal findings: Secondary | ICD-10-CM | POA: Diagnosis not present

## 2019-11-29 DIAGNOSIS — F419 Anxiety disorder, unspecified: Secondary | ICD-10-CM

## 2019-11-29 DIAGNOSIS — E559 Vitamin D deficiency, unspecified: Secondary | ICD-10-CM

## 2019-11-29 DIAGNOSIS — Z1329 Encounter for screening for other suspected endocrine disorder: Secondary | ICD-10-CM | POA: Diagnosis not present

## 2019-11-29 DIAGNOSIS — F329 Major depressive disorder, single episode, unspecified: Secondary | ICD-10-CM

## 2019-11-29 DIAGNOSIS — Z1389 Encounter for screening for other disorder: Secondary | ICD-10-CM

## 2019-11-29 DIAGNOSIS — F32A Depression, unspecified: Secondary | ICD-10-CM

## 2019-11-29 LAB — CBC WITH DIFFERENTIAL/PLATELET
Basophils Absolute: 0.1 10*3/uL (ref 0.0–0.1)
Basophils Relative: 1.2 % (ref 0.0–3.0)
Eosinophils Absolute: 0.3 10*3/uL (ref 0.0–0.7)
Eosinophils Relative: 4.7 % (ref 0.0–5.0)
HCT: 38.3 % (ref 36.0–46.0)
Hemoglobin: 12.4 g/dL (ref 12.0–15.0)
Lymphocytes Relative: 41.9 % (ref 12.0–46.0)
Lymphs Abs: 2.6 10*3/uL (ref 0.7–4.0)
MCHC: 32.4 g/dL (ref 30.0–36.0)
MCV: 83.6 fl (ref 78.0–100.0)
Monocytes Absolute: 0.4 10*3/uL (ref 0.1–1.0)
Monocytes Relative: 6.9 % (ref 3.0–12.0)
Neutro Abs: 2.8 10*3/uL (ref 1.4–7.7)
Neutrophils Relative %: 45.3 % (ref 43.0–77.0)
Platelets: 218 10*3/uL (ref 150.0–400.0)
RBC: 4.58 Mil/uL (ref 3.87–5.11)
RDW: 13.4 % (ref 11.5–15.5)
WBC: 6.2 10*3/uL (ref 4.0–10.5)

## 2019-11-29 LAB — COMPREHENSIVE METABOLIC PANEL
ALT: 17 U/L (ref 0–35)
AST: 16 U/L (ref 0–37)
Albumin: 4 g/dL (ref 3.5–5.2)
Alkaline Phosphatase: 53 U/L (ref 39–117)
BUN: 12 mg/dL (ref 6–23)
CO2: 25 mEq/L (ref 19–32)
Calcium: 8.8 mg/dL (ref 8.4–10.5)
Chloride: 106 mEq/L (ref 96–112)
Creatinine, Ser: 0.94 mg/dL (ref 0.40–1.20)
GFR: 81.37 mL/min (ref >60.00)
Glucose, Bld: 99 mg/dL (ref 70–99)
Potassium: 3.9 mEq/L (ref 3.5–5.1)
Sodium: 137 mEq/L (ref 135–145)
Total Bilirubin: 0.3 mg/dL (ref 0.2–1.2)
Total Protein: 7.3 g/dL (ref 6.0–8.3)

## 2019-11-29 LAB — LIPID PANEL
Cholesterol: 181 mg/dL (ref 0–200)
HDL: 54.4 mg/dL (ref >39.00)
LDL Cholesterol: 117 mg/dL — ABNORMAL HIGH (ref 0–99)
NonHDL: 127.04
Total CHOL/HDL Ratio: 3
Triglycerides: 50 mg/dL (ref 0.0–149.0)
VLDL: 10 mg/dL (ref 0.0–40.0)

## 2019-11-29 LAB — VITAMIN D 25 HYDROXY (VIT D DEFICIENCY, FRACTURES): VITD: 25.89 ng/mL — ABNORMAL LOW (ref 30.00–100.00)

## 2019-11-29 LAB — TSH: TSH: 1.46 u[IU]/mL (ref 0.35–4.50)

## 2019-11-30 LAB — URINALYSIS, ROUTINE W REFLEX MICROSCOPIC
Bacteria, UA: NONE SEEN /HPF
Bilirubin Urine: NEGATIVE
Glucose, UA: NEGATIVE
Hyaline Cast: NONE SEEN /LPF
Ketones, ur: NEGATIVE
Nitrite: NEGATIVE
Protein, ur: NEGATIVE
RBC / HPF: NONE SEEN /HPF (ref 0–2)
Specific Gravity, Urine: 1.023 (ref 1.001–1.03)
pH: 5.5 (ref 5.0–8.0)

## 2019-12-06 ENCOUNTER — Encounter: Payer: Self-pay | Admitting: Internal Medicine

## 2019-12-06 ENCOUNTER — Other Ambulatory Visit: Payer: Self-pay

## 2019-12-06 ENCOUNTER — Ambulatory Visit (INDEPENDENT_AMBULATORY_CARE_PROVIDER_SITE_OTHER): Admitting: Internal Medicine

## 2019-12-06 VITALS — BP 110/78 | HR 82 | Temp 98.2°F | Ht 63.0 in | Wt 205.4 lb

## 2019-12-06 DIAGNOSIS — E669 Obesity, unspecified: Secondary | ICD-10-CM | POA: Diagnosis not present

## 2019-12-06 DIAGNOSIS — E66812 Obesity, class 2: Secondary | ICD-10-CM | POA: Insufficient documentation

## 2019-12-06 DIAGNOSIS — Z0000 Encounter for general adult medical examination without abnormal findings: Secondary | ICD-10-CM | POA: Insufficient documentation

## 2019-12-06 DIAGNOSIS — Z1329 Encounter for screening for other suspected endocrine disorder: Secondary | ICD-10-CM

## 2019-12-06 DIAGNOSIS — Z13220 Encounter for screening for lipoid disorders: Secondary | ICD-10-CM | POA: Diagnosis not present

## 2019-12-06 DIAGNOSIS — R319 Hematuria, unspecified: Secondary | ICD-10-CM

## 2019-12-06 DIAGNOSIS — E559 Vitamin D deficiency, unspecified: Secondary | ICD-10-CM

## 2019-12-06 DIAGNOSIS — R21 Rash and other nonspecific skin eruption: Secondary | ICD-10-CM

## 2019-12-06 NOTE — Progress Notes (Signed)
Chief Complaint  Patient presents with  . Follow-up   Annual  1. Reviewed labs 11/29/19 vitamin D def ldl improved to 117 she just started exercising with the bike had blood in urine due to on cycle 2. Cyclical breast pain mammmogram 07/2019 normal she declines to be on OCP/contraception and will try physical measures breast pain on cycle  3. Anxiety improved GAD 7 score 1 therapy with [facility] is helping her anxiety   Review of Systems  Constitutional: Negative for weight loss.  HENT: Negative for hearing loss.   Eyes: Negative for blurred vision.  Respiratory: Negative for shortness of breath.   Cardiovascular: Negative for chest pain.  Gastrointestinal: Negative for abdominal pain.  Musculoskeletal: Negative for falls.  Skin: Negative for rash.  Neurological: Negative for headaches.  Psychiatric/Behavioral: The patient is not nervous/anxious.    Past Medical History:  Diagnosis Date  . Abnormal Pap smear 2004,2005   Hx of.  . Acid reflux   . Asthma    Induced w/ weather changes;inhaler prn  . Migraines 2012  . Seizure ([place])    x 2 as a Toddler   Past Surgical History:  Procedure Laterality Date  . WISDOM TOOTH EXTRACTION  2007   all 4 removed   Family History  Problem Relation Age of Onset  . Stroke Maternal Grandfather   . Hypertension Maternal Grandfather   . Cancer Paternal Uncle        Throat  . Cancer Paternal Grandmother        Unsure which type  . Migraines Father   . Other Father        cardiac  amyloidosis  . Asthma Sister        x 2  . Asthma Cousin        Maternal  . Breast cancer Cousin   . Asthma Other        Nephew  . Kidney failure Maternal Uncle   . Lupus Sister   . Diverticulitis Mother   . Stroke Maternal Grandmother   . Cancer Paternal Uncle        stomach cancer   . Other Brother        covid 71    Social History   Socioeconomic History  . Marital status: Married    Spouse name: Not on file  . Number of children:  Not on file  . Years of education: Not on file  . Highest education level: Not on file  Occupational History  . Occupation: CSR    Employer: [employer]  Tobacco Use  . Smoking status: Never Smoker  . Smokeless tobacco: Never Used  Substance and Sexual Activity  . Alcohol use: Yes    Alcohol/week: 2.0 standard drinks    Types: 2 Standard drinks or equivalent per week    Comment: socially   . Drug use: No  . Sexual activity: Yes    Partners: Male    Birth control/protection: Pill  Other Topics Concern  . Not on file  Social History Narrative   Married with children 2 children    She is middle child   Social Determinants of Radio broadcast assistant Strain:   . Difficulty of Paying Living Expenses:   Food Insecurity:   . Worried About Charity fundraiser in the Last Year:   . Arboriculturist in the Last Year:   Transportation Needs:   . Lack  of Transportation (Medical):   Marland Kitchen Lack of Transportation (Non-Medical):   Physical Activity:   . Days of Exercise per Week:   . Minutes of Exercise per Session:   Stress:   . Feeling of Stress :   Social Connections:   . Frequency of Communication with Friends and Family:   . Frequency of Social Gatherings with Friends and Family:   . Attends Religious Services:   . Active Member of Clubs or Organizations:   . Attends Archivist Meetings:   Marland Kitchen Marital Status:   Intimate Partner Violence:   . Fear of Current or Ex-Partner:   . Emotionally Abused:   Marland Kitchen Physically Abused:   . Sexually Abused:    Current Meds  Medication Sig  . acetaminophen (TYLENOL) 500 MG tablet Take 1,000 mg by mouth every 6 (six) hours as needed for moderate pain or headache.  . albuterol (VENTOLIN HFA) 108 (90 Base) MCG/ACT inhaler Inhale 1-2 puffs into the lungs every 4 (four) hours as needed for wheezing or shortness of breath. Rescue inhaler  . ibuprofen (ADVIL,MOTRIN) 600 MG tablet Take 1 tablet (600 mg total)  by mouth every 6 (six) hours.  . montelukast (SINGULAIR) 10 MG tablet Take 1 tablet (10 mg total) by mouth at bedtime.  . Multiple Vitamin (MULTIVITAMIN) tablet Take 1 tablet by mouth daily.   No Known Allergies Recent Results (from the past 2160 hour(s))  Vitamin D (25 hydroxy)     Status: Abnormal   Collection Time: 11/29/19  8:17 AM  Result Value Ref Range   VITD 25.89 (L) 30.00 - 100.00 ng/mL  TSH     Status: None   Collection Time: 11/29/19  8:17 AM  Result Value Ref Range   TSH 1.46 0.35 - 4.50 uIU/mL  Urinalysis, Routine w reflex microscopic     Status: Abnormal   Collection Time: 11/29/19  8:17 AM  Result Value Ref Range   Color, Urine YELLOW YELLOW   APPearance CLEAR CLEAR   Specific Gravity, Urine 1.023 1.001 - 1.03   pH 5.5 5.0 - 8.0   Glucose, UA NEGATIVE NEGATIVE   Bilirubin Urine NEGATIVE NEGATIVE   Ketones, ur NEGATIVE NEGATIVE   Hgb urine dipstick TRACE (A) NEGATIVE   Protein, ur NEGATIVE NEGATIVE   Nitrite NEGATIVE NEGATIVE   Leukocytes,Ua TRACE (A) NEGATIVE   WBC, UA 6-10 (A) 0 - 5 /HPF   RBC / HPF NONE SEEN 0 - 2 /HPF   Squamous Epithelial / LPF 0-5 < OR = 5 /HPF   Bacteria, UA NONE SEEN NONE SEEN /HPF   Hyaline Cast NONE SEEN NONE SEEN /LPF  Lipid panel     Status: Abnormal   Collection Time: 11/29/19  8:17 AM  Result Value Ref Range   Cholesterol 181 0 - 200 mg/dL    Comment: ATP III Classification       Desirable:  < 200 mg/dL               Borderline High:  200 - 239 mg/dL          High:  > = 240 mg/dL   Triglycerides 50.0 0.0 - 149.0 mg/dL    Comment: Normal:  <150 mg/dLBorderline High:  150 - 199 mg/dL   HDL 54.40 >39.00 mg/dL   VLDL 10.0 0.0 - 40.0 mg/dL   LDL Cholesterol 117 (H) 0 - 99 mg/dL   Total CHOL/HDL Ratio 3     Comment:  Men          Women1/2 Average Risk     3.4          3.3Average Risk          5.0          4.42X Average Risk          9.6          7.13X Average Risk          15.0          11.0                        NonHDL 127.04     Comment: NOTE:  Non-HDL goal should be 30 mg/dL higher than patient's LDL goal (i.e. LDL goal of < 70 mg/dL, would have non-HDL goal of < 100 mg/dL)  CBC with Differential/Platelet     Status: None   Collection Time: 11/29/19  8:17 AM  Result Value Ref Range   WBC 6.2 4.0 - 10.5 K/uL   RBC 4.58 3.87 - 5.11 Mil/uL   Hemoglobin 12.4 12.0 - 15.0 g/dL   HCT 38.3 36.0 - 46.0 %   MCV 83.6 78.0 - 100.0 fl   MCHC 32.4 30.0 - 36.0 g/dL   RDW 13.4 11.5 - 15.5 %   Platelets 218.0 150.0 - 400.0 K/uL   Neutrophils Relative % 45.3 43.0 - 77.0 %   Lymphocytes Relative 41.9 12.0 - 46.0 %   Monocytes Relative 6.9 3.0 - 12.0 %   Eosinophils Relative 4.7 0.0 - 5.0 %   Basophils Relative 1.2 0.0 - 3.0 %   Neutro Abs 2.8 1.4 - 7.7 K/uL   Lymphs Abs 2.6 0.7 - 4.0 K/uL   Monocytes Absolute 0.4 0.1 - 1.0 K/uL   Eosinophils Absolute 0.3 0.0 - 0.7 K/uL   Basophils Absolute 0.1 0.0 - 0.1 K/uL  Comprehensive metabolic panel     Status: None   Collection Time: 11/29/19  8:17 AM  Result Value Ref Range   Sodium 137 135 - 145 mEq/L   Potassium 3.9 3.5 - 5.1 mEq/L   Chloride 106 96 - 112 mEq/L   CO2 25 19 - 32 mEq/L   Glucose, Bld 99 70 - 99 mg/dL   BUN 12 6 - 23 mg/dL   Creatinine, Ser 0.94 0.40 - 1.20 mg/dL   Total Bilirubin 0.3 0.2 - 1.2 mg/dL   Alkaline Phosphatase 53 39 - 117 U/L   AST 16 0 - 37 U/L   ALT 17 0 - 35 U/L   Total Protein 7.3 6.0 - 8.3 g/dL   Albumin 4.0 3.5 - 5.2 g/dL   GFR 81.37 >60.00 mL/min   Calcium 8.8 8.4 - 10.5 mg/dL   Objective  Body mass index is 36.38 kg/m. Wt Readings from Last 3 Encounters:  12/06/19 205 lb 6.4 oz (93.2 kg)  08/03/19 190 lb (86.2 kg)  04/11/19 190 lb (86.2 kg)   Temp Readings from Last 3 Encounters:  12/06/19 98.2 F (36.8 C) (Temporal)  05/18/18 98.6 F (37 C) (Oral)  11/26/17 98.3 F (36.8 C) (Oral)   BP Readings from Last 3 Encounters:  12/06/19 110/78  04/11/19 120/80  05/18/18 102/64   Pulse Readings from Last 3  Encounters:  12/06/19 82  04/11/19 73  05/18/18 86    Physical Exam Vitals and nursing note reviewed.  Constitutional:      Appearance: Normal appearance. She is well-developed and well-groomed.  She is obese.  HENT:     Head: Normocephalic and atraumatic.  Eyes:     Conjunctiva/sclera: Conjunctivae normal.     Pupils: Pupils are equal, round, and reactive to light.  Cardiovascular:     Rate and Rhythm: Normal rate and regular rhythm.     Heart sounds: Normal heart sounds. No murmur.  Pulmonary:     Effort: Pulmonary effort is normal.     Breath sounds: Normal breath sounds.  Abdominal:     General: Abdomen is flat. Bowel sounds are normal.     Tenderness: There is no abdominal tenderness.  Skin:    General: Skin is warm and dry.  Neurological:     General: No focal deficit present.     Mental Status: She is alert and oriented to person, place, and time. Mental status is at baseline.     Gait: Gait normal.  Psychiatric:        Attention and Perception: Attention and perception normal.        Mood and Affect: Mood and affect normal.        Speech: Speech normal.        Behavior: Behavior is cooperative.        Thought Content: Thought content normal.        Cognition and Memory: Cognition and memory normal.        Judgment: Judgment normal.     Assessment  Plan  Annual physical exam Flu shotdue 2020 will get in target per pt as of 08/03/2019 but did not get as of 12/06/19   Tdap up to datenotes state had 05/27/17 and possibly 12/26/12 ob/gyn  pna 23 had 5/5/14consider repeatwith asthma hx Immune hep Band MMR covid vaccine ? Considering disc today  Pap had 02/2016 request records [facility] Dr. Benjie Karvonen -pap 04/20/18 neg neg HPV Saw ob/gyn 04/19/18 Dr. Alesia Richards did STD check and pap no results in notes. -requested pap  Saw [facility] Dr. Alesia Richards 05/16/19  H/o anxiety and binge eating rec disc with PCP and checked STDS CT/NG/TV/HIV/HBsAg/RPR/HCV and yeast  vaginitis  rec healthy diet and exercise   Vitamin D deficiency D3 5000 IU qd encouraged to take  Obesity (BMI 30-39.9) rec healthy diet and exercise       Provider: Dr. Olivia Mackie McLean-Scocuzza-Internal Medicine

## 2019-12-06 NOTE — Patient Instructions (Addendum)
COVID-19 Vaccine Information can be found at: [email or web address] For questions related to vaccine distribution or appointments, please email [email or web address] or call [phone].   Pfizer x 2 doses   Make sure you take vitamin D please   Allergy Xyzal as needed  Similasan Complete eye relief [facility]   OR Rx eye drops [place]   Breast Tenderness Breast tenderness is a common problem for women of all ages, but may also occur in men. Breast tenderness may range from mild discomfort to severe pain. In women, the pain usually comes and goes with the menstrual cycle, but it can also be constant. Breast tenderness has many possible causes, including hormone changes, infections, and taking certain medicines. You may have tests, such as a mammogram or an ultrasound, to check for any unusual findings. Having breast tenderness usually does not mean that you have breast cancer. Follow these instructions at home: Managing pain and discomfort   If directed, put ice to the painful area. To do this: ? Put ice in a plastic bag. ? Place a towel between your skin and the bag. ? Leave the ice on for 20 minutes, 2-3 times a day.  Wear a supportive bra, especially during exercise. You may also want to wear a supportive bra while sleeping if your breasts are very tender. Medicines  Take over-the-counter and prescription medicines only as told by your health care provider. If the cause of your pain is infection, you may be prescribed an antibiotic medicine.  If you were prescribed an antibiotic, take it as told by your health care provider. Do not stop taking the antibiotic even if you start to feel better. Eating and drinking  Your health care provider may recommend that you lessen the amount of fat in your diet. You can do this by: ? Limiting fried foods. ? Cooking foods using methods such as baking, boiling, grilling, and  broiling.  Decrease the amount of caffeine in your diet. Instead, drink more water and choose caffeine-free drinks. General instructions   Keep a log of the days and times when your breasts are most tender.  Ask your health care provider how to do breast exams at home. This will help you notice if you have an unusual growth or lump.  Keep all follow-up visits as told by your health care provider. This is important. Contact a health care provider if:  Any part of your breast is hard, red, and hot to the touch. This may be a sign of infection.  You are a woman and: ? Not breastfeeding and you have fluid, especially blood or pus, coming out of your nipples. ? Have a new or painful lump in your breast that remains after your menstrual period ends.  You have a fever.  Your pain does not improve or it gets worse.  Your pain is interfering with your daily activities. Summary  Breast tenderness may range from mild discomfort to severe pain.  Breast tenderness has many possible causes, including hormone changes, infections, and taking certain medicines.  It can be treated with ice, wearing a supportive bra, and medicines.  Make changes to your diet if told to by your health care provider. This information is not intended to replace advice given to you by your health care provider. Make sure you discuss any questions you have with your health care provider. Document Revised: 01/02/2019 Document Reviewed: 01/02/2019 Elsevier Patient Education  2020 Elsevier Inc.   Fibrocystic Breast Changes  Fibrocystic breast changes are changes in breast  tissue that can cause breasts to become swollen, lumpy, or painful. This can happen due to buildup of scar-like tissue (fibrous tissue) or the forming of fluid-filled lumps (cysts) in the breast. This is a common condition, and it is not cancerous (is benign). The exact cause is not known, but it seems to occur when women go through hormonal changes  during their menstrual cycle. Fibrocystic breast changes can affect one or both breasts. What are the causes? The exact cause of fibrocystic breast changes is not known. However, this condition:  May be related to the female hormones estrogen and progesterone.  May be influenced by family traits that get passed from parent to child (genetics). What are the signs or symptoms? Symptoms of this condition may affect one or both breasts, and may include:  Tenderness, mild discomfort, or pain.  Swelling.  Rope-like tissue that can be felt when touching the breast.  Lumps in one or both breasts.  Changes in breast size. Breasts may get larger before the menstrual period and smaller after the menstrual period.  Green or dark brown discharge from the nipple. Symptoms are usually worse before menstrual periods start, and they get better toward the end of menstrual periods. How is this diagnosed? This condition is diagnosed based on your medical history and a physical exam of your breasts. You may also have tests, such as:  A breast X-ray (mammogram).  Ultrasound of your breasts.  MRI.  Removal of a breast tissue sample for testing (breast biopsy). This may be done if your health care provider thinks that something else may be causing changes in your breasts. How is this treated? Often, treatment is not needed for this condition. In some cases, treatment may include:  Taking over-the-counter pain relievers to help lessen pain or discomfort.  Limiting or avoiding caffeine. Foods and beverages that contain caffeine include chocolate, soda, coffee, and tea.  Reducing sugar and fat in your diet. Your health care provider may also recommend:  A procedure to remove fluid from a cyst that is causing pain (fine needle aspiration).  Surgery to remove a cyst that is large or tender or does not go away. Follow these instructions at home:  Examine your breasts after every menstrual period. If  you do not have menstrual periods, check your breasts on the first day of every month. Feel for changes in your breasts, such as: ? More tenderness. ? A new growth. ? A change in size. ? A change in an existing lump.  Take over-the-counter and prescription medicines only as told by your health care provider.  Wear a well-fitted support or sports bra, especially when exercising.  Decrease or avoid caffeine, fat, and sugar in your diet as directed by your health care provider. Contact a health care provider if:  You have fluid leaking from your nipple, especially if it is bloody.  You have new lumps or bumps in your breast.  Your breast becomes enlarged, red, and painful.  You have areas of your breast that pucker inward.  Your nipple appears flat or indented. Get help right away if:  You have redness of your breast and the redness is spreading. Summary  Fibrocystic breast changes are changes in breast tissue that can cause breasts to become swollen, lumpy, or painful.  This condition may be related to the female hormones estrogen and progesterone.  With this condition, it is important to examine your breasts after every menstrual period. If you do not have menstrual periods, check  your breasts on the first day of every month. This information is not intended to replace advice given to you by your health care provider. Make sure you discuss any questions you have with your health care provider. Document Revised: 07/23/2017 Document Reviewed: 04/08/2016 Elsevier Patient Education  [employer].

## 2019-12-11 ENCOUNTER — Telehealth: Payer: Self-pay | Admitting: Internal Medicine

## 2019-12-11 NOTE — Telephone Encounter (Signed)
Please advise 

## 2019-12-11 NOTE — Telephone Encounter (Signed)
Pt is no longer needing any referrals.

## 2019-12-11 NOTE — Telephone Encounter (Signed)
Sorry what is needed?  Appears she has seen Dr. [name] and was established and may have tried to make her own sports medicine f/u as she was seen before but if knee pain is chronic rec ortho at [facility] in [place] Dr. Charlann Boxer  Does she need referral?  I have not referred her recently to sports med or ortho Does she need referral?  TMS

## 2019-12-11 NOTE — Telephone Encounter (Signed)
Patient called office back to let Dr. Shirlee Latch know that the other office is running her insurance. She went ahead and made an appointment.

## 2019-12-11 NOTE — Telephone Encounter (Signed)
Patient saw Dr. Shirlee Latch last week and she referred her to Dr.Rigby for her knee injury. He is no longer at that office and the office does not take patient's insurance. She needs another referral to a knee specialist.

## 2019-12-11 NOTE — Telephone Encounter (Signed)
For your information. Disregard last message request for new referral

## 2020-03-06 ENCOUNTER — Encounter: Payer: Self-pay | Admitting: Internal Medicine

## 2020-03-10 NOTE — Addendum Note (Signed)
Addended by: Quentin Ore on: 03/10/2020 11:34 PM   Modules accepted: Orders

## 2020-03-19 ENCOUNTER — Encounter: Payer: Self-pay | Admitting: Internal Medicine

## 2020-03-19 ENCOUNTER — Telehealth (INDEPENDENT_AMBULATORY_CARE_PROVIDER_SITE_OTHER): Admitting: Internal Medicine

## 2020-03-19 VITALS — Ht 63.0 in | Wt 200.0 lb

## 2020-03-19 DIAGNOSIS — L219 Seborrheic dermatitis, unspecified: Secondary | ICD-10-CM | POA: Insufficient documentation

## 2020-03-19 DIAGNOSIS — L309 Dermatitis, unspecified: Secondary | ICD-10-CM

## 2020-03-19 DIAGNOSIS — L239 Allergic contact dermatitis, unspecified cause: Secondary | ICD-10-CM | POA: Diagnosis not present

## 2020-03-19 MED ORDER — KETOCONAZOLE 2 % EX SHAM: 1.0000 "application " | MEDICATED_SHAMPOO | CUTANEOUS | 11 refills | Status: AC

## 2020-03-19 MED ORDER — TRIAMCINOLONE ACETONIDE 0.1 % EX OINT: 1.0000 "application " | TOPICAL_OINTMENT | Freq: Two times a day (BID) | CUTANEOUS | 0 refills | Status: AC

## 2020-03-19 MED ORDER — HYDROCORTISONE 2.5 % EX OINT: TOPICAL_OINTMENT | Freq: Two times a day (BID) | CUTANEOUS | 11 refills | Status: AC

## 2020-03-19 NOTE — Patient Instructions (Signed)
Contact Dermatitis Dermatitis is redness, soreness, and swelling (inflammation) of the skin. Contact dermatitis is a reaction to something that touches the skin. There are two types of contact dermatitis:  Irritant contact dermatitis. This happens when something bothers (irritates) your skin, like soap.  Allergic contact dermatitis. This is caused when you are exposed to something that you are allergic to, such as poison ivy. What are the causes?  Common causes of irritant contact dermatitis include: ? Makeup. ? Soaps. ? Detergents. ? Bleaches. ? Acids. ? Metals, such as nickel.  Common causes of allergic contact dermatitis include: ? Plants. ? Chemicals. ? Jewelry. ? Latex. ? Medicines. ? Preservatives in products, such as clothing. What increases the risk?  Having a job that exposes you to things that bother your skin.  Having asthma or eczema. What are the signs or symptoms? Symptoms may happen anywhere the irritant has touched your skin. Symptoms include:  Dry or flaky skin.  Redness.  Cracks.  Itching.  Pain or a burning feeling.  Blisters.  Blood or clear fluid draining from skin cracks. With allergic contact dermatitis, swelling may occur. This may happen in places such as the eyelids, mouth, or genitals. How is this treated?  This condition is treated by checking for the cause of the reaction and protecting your skin. Treatment may also include: ? Steroid creams, ointments, or medicines. ? Antibiotic medicines or other ointments, if you have a skin infection. ? Lotion or medicines to help with itching. ? A bandage (dressing). Follow these instructions at home: Skin care  Moisturize your skin as needed.  Put cool cloths on your skin.  Put a baking soda paste on your skin. Stir water into baking soda until it looks like a paste.  Do not scratch your skin.  Avoid having things rub up against your skin.  Avoid the use of soaps, perfumes, and  dyes. Medicines  Take or apply over-the-counter and prescription medicines only as told by your doctor.  If you were prescribed an antibiotic medicine, take or apply it as told by your doctor. Do not stop using it even if your condition starts to get better. Bathing  Take a bath with: ? Epsom salts. ? Baking soda. ? Colloidal oatmeal.  Bathe less often.  Bathe in warm water. Avoid using hot water. Bandage care  If you were given a bandage, change it as told by your health care provider.  Wash your hands with soap and water before and after you change your bandage. If soap and water are not available, use hand sanitizer. General instructions  Avoid the things that caused your reaction. If you do not know what caused it, keep a journal. Write down: ? What you eat. ? What skin products you use. ? What you drink. ? What you wear in the area that has symptoms. This includes jewelry.  Check the affected areas every day for signs of infection. Check for: ? More redness, swelling, or pain. ? More fluid or blood. ? Warmth. ? Pus or a bad smell.  Keep all follow-up visits as told by your doctor. This is important. Contact a doctor if:  You do not get better with treatment.  Your condition gets worse.  You have signs of infection, such as: ? More swelling. ? Tenderness. ? More redness. ? Soreness. ? Warmth.  You have a fever.  You have new symptoms. Get help right away if:  You have a very bad headache.  You have neck pain.  Your neck is stiff.  You throw up (vomit).  You feel very sleepy.  You see red streaks coming from the area.  Your bone or joint near the area hurts after the skin has healed.  The area turns darker.  You have trouble breathing. Summary  Dermatitis is redness, soreness, and swelling of the skin.  Symptoms may occur where the irritant has touched you.  Treatment may include medicines and skin care.  If you do not know what caused  your reaction, keep a journal.  Contact a doctor if your condition gets worse or you have signs of infection. This information is not intended to replace advice given to you by your health care provider. Make sure you discuss any questions you have with your health care provider. Document Revised: 11/30/2018 Document Reviewed: 02/23/2018 Elsevier Patient Education  2020 Elsevier Inc.  Eczema Eczema is a broad term for a group of skin conditions that cause skin to become rough and inflamed. Each type of eczema has different triggers, symptoms, and treatments. Eczema of any type is usually itchy and symptoms range from mild to severe. Eczema and its symptoms are not spread from person to person (are not contagious). It can appear on different parts of the body at different times. Your eczema may not look the same as someone else's eczema. What are the types of eczema? Atopic dermatitis This is a long-term (chronic) skin disease that keeps coming back (recurring). Usual symptoms are dry skin and small, solid pimples that may swell and leak fluid (weep). Contact dermatitis  This happens when something irritates the skin and causes a rash. The irritation can come from substances that you are allergic to (allergens), such as poison ivy, chemicals, or medicines that were applied to your skin. Dyshidrotic eczema This is a form of eczema on the hands and feet. It shows up as very itchy, fluid-filled blisters. It can affect people of any age, but is more common before age 57. Hand eczema  This causes very itchy areas of skin on the palms and sides of the hands and fingers. This type of eczema is common in industrial jobs where you may be exposed to many different types of irritants. Lichen simplex chronicus This type of eczema occurs when a person constantly scratches one area of the body. Repeated scratching of the area leads to thickened skin (lichenification). Lichen simplex chronicus can occur along  with other types of eczema. It is more common in adults, but may be seen in children as well. Nummular eczema This is a common type of eczema. It has no known cause. It typically causes a red, circular, crusty lesion (plaque) that may be itchy. Scratching may become a habit and can cause bleeding. Nummular eczema occurs most often in people of middle-age or older. It most often affects the hands. Seborrheic dermatitis This is a common skin disease that mainly affects the scalp. It may also affect any oily areas of the body, such as the face, sides of nose, eyebrows, ears, eyelids, and chest. It is marked by small scaling and redness of the skin (erythema). This can affect people of all ages. In infants, this condition is known as Location manager." Stasis dermatitis This is a common skin disease that usually appears on the legs and feet. It most often occurs in people who have a condition that prevents blood from being pumped through the veins in the legs (chronic venous insufficiency). Stasis dermatitis is a chronic condition that needs long-term management.  How is eczema diagnosed? Your health care provider will examine your skin and review your medical history. He or she may also give you skin patch tests. These tests involve taking patches that contain possible allergens and placing them on your back. He or she will then check in a few days to see if an allergic reaction occurred. What are the common treatments? Treatment for eczema is based on the type of eczema you have. Hydrocortisone steroid medicine can relieve itching quickly and help reduce inflammation. This medicine may be prescribed or obtained over-the-counter, depending on the strength of the medicine that is needed. Follow these instructions at home:  Take over-the-counter and prescription medicines only as told by your health care provider.  Use creams or ointments to moisturize your skin. Do not use lotions.  Learn what triggers or  irritates your symptoms. Avoid these things.  Treat symptom flare-ups quickly.  Do not itch your skin. This can make your rash worse.  Keep all follow-up visits as told by your health care provider. This is important. Where to find more information  The American Academy of Dermatology: InfoExam.si  The National Eczema Association: www.nationaleczema.org Contact a health care provider if:  You have serious itching, even with treatment.  You regularly scratch your skin until it bleeds.  Your rash looks different than usual.  Your skin is painful, swollen, or more red than usual.  You have a fever. Summary  There are eight general types of eczema. Each type has different triggers.  Eczema of any type causes itching that may range from mild to severe.  Treatment varies based on the type of eczema you have. Hydrocortisone steroid medicine can help with itching and inflammation.  Protecting your skin is the best way to prevent eczema. Use moisturizers and lotions. Avoid triggers and irritants, and treat flare-ups quickly. This information is not intended to replace advice given to you by your health care provider. Make sure you discuss any questions you have with your health care provider. Document Revised: 07/23/2017 Document Reviewed: 12/24/2016 Elsevier Patient Education  2020 Elsevier Inc.  Seborrheic Dermatitis, Adult Seborrheic dermatitis is a skin disease that causes red, scaly patches. It usually occurs on the scalp, and it is often called dandruff. The patches may appear on other parts of the body. Skin patches tend to appear where there are many oil glands in the skin. Areas of the body that are commonly affected include:  Scalp.  Skin folds of the body.  Ears.  Eyebrows.  Neck.  Face.  Armpits.  The bearded area of men's faces. The condition may come and go for no known reason, and it is often long-lasting (chronic). What are the causes? The cause of this  condition is not known. What increases the risk? This condition is more likely to develop in people who:  Have certain conditions, such as: ? HIV (human immunodeficiency virus). ? AIDS (acquired immunodeficiency syndrome). ? Parkinson disease. ? Mood disorders, such as depression.  Are 93-42 years old. What are the signs or symptoms? Symptoms of this condition include:  Thick scales on the scalp.  Redness on the face or in the armpits.  Skin that is flaky. The flakes may be white or yellow.  Skin that seems oily or dry but is not helped with moisturizers.  Itching or burning in the affected areas. How is this diagnosed? This condition is diagnosed with a medical history and physical exam. A sample of your skin may be tested (skin biopsy).  You may need to see a skin specialist (dermatologist). How is this treated? There is no cure for this condition, but treatment can help to manage the symptoms. You may get treatment to remove scales, lower the risk of skin infection, and reduce swelling or itching. Treatment may include:  Creams that reduce swelling and irritation (steroids).  Creams that reduce skin yeast.  Medicated shampoo, soaps, moisturizing creams, or ointments.  Medicated moisturizing creams or ointments. Follow these instructions at home:  Apply over-the-counter and prescription medicines only as told by your health care provider.  Use any medicated shampoo, soaps, skin creams, or ointments only as told by your health care provider.  Keep all follow-up visits as told by your health care provider. This is important. Contact a health care provider if:  Your symptoms do not improve with treatment.  Your symptoms get worse.  You have new symptoms. This information is not intended to replace advice given to you by your health care provider. Make sure you discuss any questions you have with your health care provider. Document Revised: 07/23/2017 Document Reviewed:  11/28/2015 Elsevier Patient Education  2020 [employer].

## 2020-03-19 NOTE — Progress Notes (Signed)
Virtual Visit via Video Note  I connected with Sylvia Townsend  on 03/19/20 at 10:30 AM EDT by a video enabled telemedicine application and verified that I am speaking with the correct person using two identifiers.  Location patient: home Location provider:work or home office Persons participating in the virtual visit: patient, provider, pts daughter  I discussed the limitations of evaluation and management by telemedicine and the availability of in person appointments. The patient expressed understanding and agreed to proceed.   HPI: 1. Rash to hairline, neck and upper chest x 1 month h/o this happenening 1 year ago recently used new hair products and goat soap and using dove soap. Rash if painful dry esp after shower itchy and burning. 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 Rash started out as bumps now itchy darker in color and dry     ROS: See pertinent positives and negatives per HPI.  Past Medical History:  Diagnosis Date  . Abnormal Pap smear 2004,2005   Hx of.  . Acid reflux   . Asthma    Induced w/ weather changes;inhaler prn  . Migraines 2012  . Seizure (HCC)    x 2 as a Toddler    Past Surgical History:  Procedure Laterality Date  . WISDOM TOOTH EXTRACTION  2007   all 4 removed    Family History  Problem Relation Age of Onset  . Stroke Maternal Grandfather   . Hypertension Maternal Grandfather   . Cancer Paternal Uncle        Throat  . Cancer Paternal Grandmother        Unsure which type  . Migraines Father   . Other Father        cardiac  amyloidosis  . Asthma Sister        x 2  . Asthma Cousin        Maternal  . Breast cancer Cousin   . Asthma Other        Nephew  . Kidney failure Maternal Uncle   . Lupus Sister   . Diverticulitis Mother   . Stroke Maternal Grandmother   . Cancer Paternal Uncle        stomach cancer   . Other Brother        covid 27     SOCIAL HX: married with kids   Current Outpatient Medications:  .  acetaminophen (TYLENOL) 500 MG tablet,  Take 1,000 mg by mouth every 6 (six) hours as needed for moderate pain or headache., Disp: , Rfl:  .  albuterol (VENTOLIN HFA) 108 (90 Base) MCG/ACT inhaler, Inhale 1-2 puffs into the lungs every 4 (four) hours as needed for wheezing or shortness of breath. Rescue inhaler, Disp: 18 g, Rfl: 12 .  ibuprofen (ADVIL,MOTRIN) 600 MG tablet, Take 1 tablet (600 mg total) by mouth every 6 (six) hours., Disp: 30 tablet, Rfl: 0 .  Iron-FA-B Cmp-C-Biot-Probiotic (FUSION PLUS) CAPS, Take 1 capsule by mouth daily., Disp: , Rfl: 4 .  Multiple Vitamin (MULTIVITAMIN) tablet, Take 1 tablet by mouth daily., Disp: , Rfl:  .  hydrocortisone 2.5 % ointment, Apply topically 2 (two) times daily. Prn neck, face, Disp: 60 g, Rfl: 11 .  [START ON 03/21/2020] ketoconazole (NIZORAL) 2 % shampoo, Apply 1 application topically 2 (two) times a week. Let sit x 5 minutes then rinse, Disp: 120 mL, Rfl: 11 .  montelukast (SINGULAIR) 10 MG tablet, Take 1 tablet (10 mg total) by mouth at bedtime. (Patient not taking: Reported on 03/19/2020), Disp: 90 tablet, Rfl: 3 .  triamcinolone ointment (KENALOG) 0.1 %, Apply 1 application topically 2 (two) times daily. Neck prn, Disp: 80 g, Rfl: 0  EXAM:  VITALS per patient if applicable:  GENERAL: alert, oriented, appears well and in no acute distress  HEENT: atraumatic, conjunttiva clear, no obvious abnormalities on inspection of external nose and ears  NECK: normal movements of the head and neck  LUNGS: on inspection no signs of respiratory distress, breathing rate appears normal, no obvious gross SOB, gasping or wheezing  CV: no obvious cyanosis  MS: moves all visible extremities without noticeable abnormality  PSYCH/NEURO: pleasant and cooperative, no obvious depression or anxiety, speech and thought processing grossly intact  Skin: seb derm to scalp, eczema +/-contact derm neck and chest  ASSESSMENT AND PLAN:  Discussed the following assessment and plan:  Seborrheic dermatitis  - Plan: hydrocortisone 2.5 % ointment, ketoconazole (NIZORAL) 2 % shampoo HC to hairline   Allergic contact dermatitis, unspecified trigger - Plan: hydrocortisone 2.5 % ointment, triamcinolone ointment (KENALOG) 0.1 % Eczema, unspecified type - Plan: hydrocortisone 2.5 % ointment, triamcinolone ointment (KENALOG) 0.1 % Dove and acquaphor  Unscented products  F/u derm for patch test   -we discussed possible serious and likely etiologies, options for evaluation and workup, limitations of telemedicine visit vs in person visit, treatment, treatment risks and precautions. Pt prefers to treat via telemedicine empirically rather then risking or undertaking an in person visit at this moment. Patient agrees to seek prompt in person care if worsening, new symptoms arise, or if is not improving with treatment.   I discussed the assessment and treatment plan with the patient. The patient was provided an opportunity to ask questions and all were answered. The patient agreed with the plan and demonstrated an understanding of the instructions.   The patient was advised to call back or seek an in-person evaluation if the symptoms worsen or if the condition fails to improve as anticipated.   Pasty Spillers McLean-Scocuzza, MD

## 2020-03-19 NOTE — Progress Notes (Signed)
Patient presenting with rash extending from the hairline down both sides of the neck. When drying out the rash itches and hurts, causing stiffness. Then will turn white.   Onset over a month ago when trying a new hair product. Has not used this since. Rash still present.   States her sister has eczema. Has a history of rashes popping up in the past on the chest.

## 2020-05-09 ENCOUNTER — Encounter: Payer: Self-pay | Admitting: Internal Medicine

## 2020-05-10 ENCOUNTER — Encounter: Payer: Self-pay | Admitting: Internal Medicine

## 2020-05-28 ENCOUNTER — Encounter: Payer: Self-pay | Admitting: Internal Medicine

## 2020-08-21 ENCOUNTER — Telehealth: Payer: Self-pay | Admitting: Internal Medicine

## 2020-08-21 NOTE — Telephone Encounter (Signed)
Rejection Reason - Patient Declined - Patient called and cancelled appointment.Marland Kitchenmb" [facility] said on Aug 13, 2020 11:29 AM

## 2020-12-11 ENCOUNTER — Encounter: Admitting: Internal Medicine

## 2021-01-15 ENCOUNTER — Encounter: Payer: Self-pay | Admitting: Internal Medicine

## 2021-01-15 ENCOUNTER — Other Ambulatory Visit: Payer: Self-pay

## 2021-01-15 ENCOUNTER — Ambulatory Visit (INDEPENDENT_AMBULATORY_CARE_PROVIDER_SITE_OTHER): Admitting: Internal Medicine

## 2021-01-15 VITALS — BP 110/82 | HR 69 | Temp 98.2°F | Ht 63.19 in | Wt 214.8 lb

## 2021-01-15 DIAGNOSIS — F419 Anxiety disorder, unspecified: Secondary | ICD-10-CM

## 2021-01-15 DIAGNOSIS — R319 Hematuria, unspecified: Secondary | ICD-10-CM

## 2021-01-15 DIAGNOSIS — F32A Depression, unspecified: Secondary | ICD-10-CM

## 2021-01-15 DIAGNOSIS — Z0000 Encounter for general adult medical examination without abnormal findings: Secondary | ICD-10-CM | POA: Diagnosis not present

## 2021-01-15 DIAGNOSIS — R590 Localized enlarged lymph nodes: Secondary | ICD-10-CM

## 2021-01-15 DIAGNOSIS — Z13220 Encounter for screening for lipoid disorders: Secondary | ICD-10-CM

## 2021-01-15 DIAGNOSIS — Z1329 Encounter for screening for other suspected endocrine disorder: Secondary | ICD-10-CM | POA: Diagnosis not present

## 2021-01-15 DIAGNOSIS — E559 Vitamin D deficiency, unspecified: Secondary | ICD-10-CM

## 2021-01-15 DIAGNOSIS — J4520 Mild intermittent asthma, uncomplicated: Secondary | ICD-10-CM

## 2021-01-15 DIAGNOSIS — J45990 Exercise induced bronchospasm: Secondary | ICD-10-CM | POA: Diagnosis not present

## 2021-01-15 LAB — TSH: TSH: 1.25 u[IU]/mL (ref 0.35–4.50)

## 2021-01-15 LAB — LIPID PANEL
Cholesterol: 168 mg/dL (ref 0–200)
HDL: 51.9 mg/dL (ref >39.00)
LDL Cholesterol: 103 mg/dL — ABNORMAL HIGH (ref 0–99)
NonHDL: 115.74
Total CHOL/HDL Ratio: 3
Triglycerides: 62 mg/dL (ref 0.0–149.0)
VLDL: 12.4 mg/dL (ref 0.0–40.0)

## 2021-01-15 LAB — VITAMIN D 25 HYDROXY (VIT D DEFICIENCY, FRACTURES): VITD: 29.37 ng/mL — ABNORMAL LOW (ref 30.00–100.00)

## 2021-01-15 LAB — COMPREHENSIVE METABOLIC PANEL
ALT: 16 U/L (ref 0–35)
AST: 16 U/L (ref 0–37)
Albumin: 4.1 g/dL (ref 3.5–5.2)
Alkaline Phosphatase: 58 U/L (ref 39–117)
BUN: 9 mg/dL (ref 6–23)
CO2: 27 mEq/L (ref 19–32)
Calcium: 9.1 mg/dL (ref 8.4–10.5)
Chloride: 104 mEq/L (ref 96–112)
Creatinine, Ser: 0.86 mg/dL (ref 0.40–1.20)
GFR: 86.27 mL/min (ref >60.00)
Glucose, Bld: 90 mg/dL (ref 70–99)
Potassium: 4 mEq/L (ref 3.5–5.1)
Sodium: 137 mEq/L (ref 135–145)
Total Bilirubin: 0.7 mg/dL (ref 0.2–1.2)
Total Protein: 7.3 g/dL (ref 6.0–8.3)

## 2021-01-15 LAB — CBC WITH DIFFERENTIAL/PLATELET
Basophils Absolute: 0.1 10*3/uL (ref 0.0–0.1)
Basophils Relative: 1 % (ref 0.0–3.0)
Eosinophils Absolute: 0.1 10*3/uL (ref 0.0–0.7)
Eosinophils Relative: 2.1 % (ref 0.0–5.0)
HCT: 38.1 % (ref 36.0–46.0)
Hemoglobin: 12.4 g/dL (ref 12.0–15.0)
Lymphocytes Relative: 36.9 % (ref 12.0–46.0)
Lymphs Abs: 2.1 10*3/uL (ref 0.7–4.0)
MCHC: 32.5 g/dL (ref 30.0–36.0)
MCV: 82.4 fl (ref 78.0–100.0)
Monocytes Absolute: 0.4 10*3/uL (ref 0.1–1.0)
Monocytes Relative: 7.8 % (ref 3.0–12.0)
Neutro Abs: 3 10*3/uL (ref 1.4–7.7)
Neutrophils Relative %: 52.2 % (ref 43.0–77.0)
Platelets: 235 10*3/uL (ref 150.0–400.0)
RBC: 4.63 Mil/uL (ref 3.87–5.11)
RDW: 13.2 % (ref 11.5–15.5)
WBC: 5.7 10*3/uL (ref 4.0–10.5)

## 2021-01-15 MED ORDER — MONTELUKAST SODIUM 10 MG PO TABS: 10.0000 mg | ORAL_TABLET | Freq: Every day | ORAL | 3 refills | Status: DC

## 2021-01-15 MED ORDER — ALBUTEROL SULFATE HFA 108 (90 BASE) MCG/ACT IN AERS: 1.0000 | INHALATION_SPRAY | RESPIRATORY_TRACT | 12 refills | Status: DC | PRN

## 2021-01-15 MED ORDER — CITALOPRAM HYDROBROMIDE 10 MG PO TABS: 10.0000 mg | ORAL_TABLET | Freq: Every day | ORAL | 3 refills | Status: DC

## 2021-01-15 NOTE — Patient Instructions (Addendum)
August 4th consider 4th moderna   [facility] probiotics think about this  Pap smear have them fax results to me [phone]    Diet for Irritable Bowel Syndrome When you have irritable bowel syndrome (IBS), it is very important to eat the foods and follow the eating habits that are best for your condition. IBS may cause various symptoms such as pain in the abdomen, constipation, or diarrhea. Choosing the right foods can help to ease the discomfort from these symptoms. Work with your health care provider and diet and nutrition specialist (dietitian) to find the eating plan that will help to control your symptoms. What are tips for following this plan?  Keep a food diary. This will help you identify foods that cause symptoms. Write down: ? What you eat and when you eat it. ? What symptoms you have. ? When symptoms occur in relation to your meals, such as "pain in abdomen 2 hours after dinner."  Eat your meals slowly and in a relaxed setting.  Aim to eat 5-6 small meals per day. Do not skip meals.  Drink enough fluid to keep your urine pale yellow.  Ask your health care provider if you should take an over-the-counter probiotic to help restore healthy bacteria in your gut (digestive tract). ? Probiotics are foods that contain good bacteria and yeasts.  Your dietitian may have specific dietary recommendations for you based on your symptoms. He or she may recommend that you: ? Avoid foods that cause symptoms. Talk with your dietitian about other ways to get the same nutrients that are in those problem foods. ? Avoid foods with gluten. Gluten is a protein that is found in rye, wheat, and barley. ? Eat more foods that contain soluble fiber. Examples of foods with high soluble fiber include oats, seeds, and certain fruits and vegetables. Take a fiber supplement if directed by your dietitian. ? Reduce or avoid certain foods called FODMAPs. These are foods that contain carbohydrates that are hard to  digest. Ask your doctor which foods contain these carbohydrates.      What foods are not recommended? The following are some foods and drinks that may make your symptoms worse:  Fatty foods, such as french fries.  Foods that contain gluten, such as pasta and cereal.  Dairy products, such as milk, cheese, and ice cream.  Chocolate.  Alcohol.  Products with caffeine, such as coffee.  Carbonated drinks, such as soda.  Foods that are high in FODMAPs. These include certain fruits and vegetables.  Products with sweeteners such as honey, high fructose corn syrup, sorbitol, and mannitol. The items listed above may not be a complete list of foods and beverages you should avoid. Contact a dietitian for more information.   What foods are good sources of fiber? Your health care provider or dietitian may recommend that you eat more foods that contain fiber. Fiber can help to reduce constipation and other IBS symptoms. Add foods with fiber to your diet a little at a time so your body can get used to them. Too much fiber at one time might cause gas and swelling of your abdomen. The following are some foods that are good sources of fiber:  Berries, such as raspberries, strawberries, and blueberries.  Tomatoes.  Carrots.  Brown rice.  Oats.  Seeds, such as chia and pumpkin seeds. The items listed above may not be a complete list of recommended sources of fiber. Contact your dietitian for more options. Where to find more information  International Foundation for Functional Gastrointestinal Disorders: www.iffgd.[employer] of Diabetes and Digestive and Kidney Diseases: [employer] Summary  When you have irritable bowel syndrome (IBS), it is very important to eat the foods and follow the eating habits that are best for your condition.  IBS may cause various symptoms such as pain in the abdomen, constipation, or diarrhea.  Choosing the right foods can help to ease the  discomfort that comes from symptoms.  Keep a food diary. This will help you identify foods that cause symptoms.  Your health care provider or diet and nutrition specialist (dietitian) may recommend that you eat more foods that contain fiber. This information is not intended to replace advice given to you by your health care provider. Make sure you discuss any questions you have with your health care provider. Document Revised: 04/11/2020 Document Reviewed: 04/11/2020 Elsevier Patient Education  2021 Elsevier Inc.  Irritable Bowel Syndrome, Adult  Irritable bowel syndrome (IBS) is a group of symptoms that affects the organs responsible for digestion (gastrointestinal or GI tract). IBS is not one specific disease. To regulate how the GI tract works, the body sends signals back and forth between the intestines and the brain. If you have IBS, there may be a problem with these signals. As a result, the GI tract does not function normally. The intestines may become more sensitive and overreact to certain things. This may be especially true when you eat certain foods or when you are under stress. There are four types of IBS. These may be determined based on the consistency of your stool (feces):  IBS with diarrhea.  IBS with constipation.  Mixed IBS.  Unsubtyped IBS. It is important to know which type of IBS you have. Certain treatments are more likely to be helpful for certain types of IBS. What are the causes? The exact cause of IBS is not known. What increases the risk? You may have a higher risk for IBS if you:  Are female.  Are younger than 39.  Have a family history of IBS.  Have a mental health condition, such as depression, anxiety, or post-traumatic stress disorder.  Have had a bacterial infection of your GI tract. What are the signs or symptoms? Symptoms of IBS vary from person to person. The main symptom is abdominal pain or discomfort. Other symptoms usually include one or  more of the following:  Diarrhea, constipation, or both.  Abdominal swelling or bloating.  Feeling full after eating a small or regular-sized meal.  Frequent gas.  Mucus in the stool.  A feeling of having more stool left after a bowel movement. Symptoms tend to come and go. They may be triggered by stress, mental health conditions, or certain foods. How is this diagnosed? This condition may be diagnosed based on a physical exam, your medical history, and your symptoms. You may have tests, such as:  Blood tests.  Stool test.  X-rays.  CT scan.  Colonoscopy. This is a procedure in which your GI tract is viewed with a long, thin, flexible tube. How is this treated? There is no cure for IBS, but treatment can help relieve symptoms. Treatment depends on the type of IBS you have, and may include:  Changes to your diet, such as: ? Avoiding foods that cause symptoms. ? Drinking more water. ? Following a low-FODMAP (fermentable oligosaccharides, disaccharides, monosaccharides, and polyols) diet for up to 6 weeks, or as told by your health care provider. FODMAPs are sugars that are hard for some  people to digest. ? Eating more fiber. ? Eating medium-sized meals at the same times every day.  Medicines. These may include: ? Fiber supplements, if you have constipation. ? Medicine to control diarrhea (antidiarrheal medicines). ? Medicine to help control muscle tightening (spasms) in your GI tract (antispasmodic medicines). ? Medicines to help with mental health conditions, such as antidepressants or tranquilizers.  Talk therapy or counseling.  Working with a diet and nutrition specialist (dietitian) to help create a food plan that is right for you.  Managing your stress. Follow these instructions at home: Eating and drinking  Eat a healthy diet.  Eat medium-sized meals at about the same time every day. Do not eat large meals.  Gradually eat more fiber-rich foods. These include  whole grains, fruits, and vegetables. This may be especially helpful if you have IBS with constipation.  Eat a diet low in FODMAPs.  Drink enough fluid to keep your urine pale yellow.  Keep a journal of foods that seem to trigger symptoms.  Avoid foods and drinks that: ? Contain added sugar. ? Make your symptoms worse. Dairy products, caffeinated drinks, and carbonated drinks can make symptoms worse for some people. General instructions  Take over-the-counter and prescription medicines and supplements only as told by your health care provider.  Get enough exercise. Do at least 150 minutes of moderate-intensity exercise each week.  Manage your stress. Getting enough sleep and exercise can help you manage stress.  Keep all follow-up visits as told by your health care provider and therapist. This is important. Alcohol Use  Do not drink alcohol if: ? Your health care provider tells you not to drink. ? You are pregnant, may be pregnant, or are planning to become pregnant.  If you drink alcohol, limit how much you have: ? 0-1 drink a day for women. ? 0-2 drinks a day for men.  Be aware of how much alcohol is in your drink. In the [place] one drink equals one typical bottle of beer (12 oz), one-half glass of wine (5 oz), or one shot of hard liquor (1 oz). Contact a health care provider if you have:  Constant pain.  Weight loss.  Difficulty or pain when swallowing.  Diarrhea that gets worse. Get help right away if you have:  Severe abdominal pain.  Fever.  Diarrhea with symptoms of dehydration, such as dizziness or dry mouth.  Bright red blood in your stool.  Stool that is black and tarry.  Abdominal swelling.  Vomiting that does not stop.  Blood in your vomit. Summary  Irritable bowel syndrome (IBS) is not one specific disease. It is a group of symptoms that affects digestion.  Your intestines may become more sensitive and overreact to certain things. This may be  especially true when you eat certain foods or when you are under stress.  There is no cure for IBS, but treatment can help relieve symptoms. This information is not intended to replace advice given to you by your health care provider. Make sure you discuss any questions you have with your health care provider. Document Revised: 04/11/2020 Document Reviewed: 04/11/2020 Elsevier Patient Education  2021 Elsevier Inc.  Low-FODMAP Eating Plan  FODMAP stands for fermentable oligosaccharides, disaccharides, monosaccharides, and polyols. These are sugars that are hard for some people to digest. A low-FODMAP eating plan may help some people who have irritable bowel syndrome (IBS) and certain other bowel (intestinal) diseases to manage their symptoms. This meal plan can be complicated to follow. Work with  a diet and nutrition specialist (dietitian) to make a low-FODMAP eating plan that is right for you. A dietitian can help make sure that you get enough nutrition from this diet. What are tips for following this plan? Reading food labels  Check labels for hidden FODMAPs such as: ? High-fructose syrup. ? Honey. ? Agave. ? Natural fruit flavors. ? Onion or garlic powder.  Choose low-FODMAP foods that contain 3-4 grams of fiber per serving.  Check food labels for serving sizes. Eat only one serving at a time to make sure FODMAP levels stay low. Shopping  Shop with a list of foods that are recommended on this diet and make a meal plan. Meal planning  Follow a low-FODMAP eating plan for up to 6 weeks, or as told by your health care provider or dietitian.  To follow the eating plan: 1. Eliminate high-FODMAP foods from your diet completely. Choose only low-FODMAP foods to eat. You will do this for 2-6 weeks. 2. Gradually reintroduce high-FODMAP foods into your diet one at a time. Most people should wait a few days before introducing the next new high-FODMAP food into their meal plan. Your dietitian  can recommend how quickly you may reintroduce foods. 3. Keep a daily record of what and how much you eat and drink. Make note of any symptoms that you have after eating. 4. Review your daily record with a dietitian regularly to identify which foods you can eat and which foods you should avoid. General tips  Drink enough fluid each day to keep your urine pale yellow.  Avoid processed foods. These often have added sugar and may be high in FODMAPs.  Avoid most dairy products, whole grains, and sweeteners.  Work with a dietitian to make sure you get enough fiber in your diet.  Avoid high FODMAP foods at meals to manage symptoms. Recommended foods Fruits Bananas, oranges, tangerines, lemons, limes, blueberries, raspberries, strawberries, grapes, cantaloupe, honeydew melon, kiwi, papaya, passion fruit, and pineapple. Limited amounts of dried cranberries, banana chips, and shredded coconut. Vegetables Eggplant, zucchini, cucumber, peppers, green beans, bean sprouts, lettuce, arugula, kale, Swiss chard, spinach, collard greens, bok choy, summer squash, potato, and tomato. Limited amounts of corn, carrot, and sweet potato. Green parts of scallions. Grains Gluten-free grains, such as rice, oats, buckwheat, quinoa, corn, polenta, and millet. Gluten-free pasta, bread, or cereal. Rice noodles. Corn tortillas. Meats and other proteins Unseasoned beef, pork, poultry, or fish. Eggs. Tomasa Blase. Tofu (firm) and tempeh. Limited amounts of nuts and seeds, such as almonds, walnuts, [place] nuts, pecans, peanuts, nut butters, pumpkin seeds, chia seeds, and sunflower seeds. Dairy Lactose-free milk, yogurt, and kefir. Lactose-free cottage cheese and ice cream. Non-dairy milks, such as almond, coconut, hemp, and rice milk. Non-dairy yogurt. Limited amounts of goat cheese, brie, mozzarella, parmesan, swiss, and other hard cheeses. Fats and oils Butter-free spreads. Vegetable oils, such as olive, canola, and sunflower  oil. Seasoning and other foods Artificial sweeteners with names that do not end in "ol," such as aspartame, saccharine, and stevia. Maple syrup, white table sugar, raw sugar, brown sugar, and molasses. Mayonnaise, soy sauce, and tamari. Fresh basil, coriander, parsley, rosemary, and thyme. Beverages Water and mineral water. Sugar-sweetened soft drinks. Small amounts of orange juice or cranberry juice. Black and green tea. Most dry wines. Coffee. The items listed above may not be a complete list of foods and beverages you can eat. Contact a dietitian for more information. Foods to avoid Fruits Fresh, dried, and juiced forms of apple, pear, watermelon,  peach, plum, cherries, apricots, blackberries, boysenberries, figs, nectarines, and mango. Avocado. Vegetables Chicory root, artichoke, asparagus, cabbage, snow peas, Brussels sprouts, broccoli, sugar snap peas, mushrooms, celery, and cauliflower. Onions, garlic, leeks, and the white part of scallions. Grains Wheat, including kamut, durum, and semolina. Barley and bulgur. Couscous. Wheat-based cereals. Wheat noodles, bread, crackers, and pastries. Meats and other proteins Fried or fatty meat. Sausage. Cashews and pistachios. Soybeans, baked beans, black beans, chickpeas, kidney beans, fava beans, navy beans, lentils, black-eyed peas, and split peas. Dairy Milk, yogurt, ice cream, and soft cheese. Cream and sour cream. Milk-based sauces. Custard. Buttermilk. Soy milk. Seasoning and other foods Any sugar-free gum or candy. Foods that contain artificial sweeteners such as sorbitol, mannitol, isomalt, or xylitol. Foods that contain honey, high-fructose corn syrup, or agave. Bouillon, vegetable stock, beef stock, and chicken stock. Garlic and onion powder. Condiments made with onion, such as hummus, chutney, pickles, relish, salad dressing, and salsa. Tomato paste. Beverages Chicory-based drinks. Coffee substitutes. Chamomile tea. Fennel tea. Sweet or  fortified wines such as port or sherry. Diet soft drinks made with isomalt, mannitol, maltitol, sorbitol, or xylitol. Apple, pear, and mango juice. Juices with high-fructose corn syrup. The items listed above may not be a complete list of foods and beverages you should avoid. Contact a dietitian for more information. Summary  FODMAP stands for fermentable oligosaccharides, disaccharides, monosaccharides, and polyols. These are sugars that are hard for some people to digest.  A low-FODMAP eating plan is a short-term diet that helps to ease symptoms of certain bowel diseases.  The eating plan usually lasts up to 6 weeks. After that, high-FODMAP foods are reintroduced gradually and one at a time. This can help you find out which foods may be causing symptoms.  A low-FODMAP eating plan can be complicated. It is best to work with a dietitian who has experience with this type of plan. This information is not intended to replace advice given to you by your health care provider. Make sure you discuss any questions you have with your health care provider. Document Revised: 12/28/2019 Document Reviewed: 12/28/2019 Elsevier Patient Education  2021 Elsevier Inc.  Diarrhea, Adult Diarrhea is frequent loose and watery bowel movements. Diarrhea can make you feel weak and cause you to become dehydrated. Dehydration can make you tired and thirsty, cause you to have a dry mouth, and decrease how often you urinate. Diarrhea typically lasts 2-3 days. However, it can last longer if it is a sign of something more serious. It is important to treat your diarrhea as told by your health care provider. Follow these instructions at home: Eating and drinking Follow these recommendations as told by your health care provider:  Take an oral rehydration solution (ORS). This is an over-the-counter medicine that helps return your body to its normal balance of nutrients and water. It is found at pharmacies and retail  stores.  Drink plenty of fluids, such as water, ice chips, diluted fruit juice, and low-calorie sports drinks. You can drink milk also, if desired.  Avoid drinking fluids that contain a lot of sugar or caffeine, such as energy drinks, sports drinks, and soda.  Eat bland, easy-to-digest foods in small amounts as you are able. These foods include bananas, applesauce, rice, lean meats, toast, and crackers.  Avoid alcohol.  Avoid spicy or fatty foods.      Medicines  Take over-the-counter and prescription medicines only as told by your health care provider.  If you were prescribed an antibiotic medicine, take it  as told by your health care provider. Do not stop using the antibiotic even if you start to feel better. General instructions  Wash your hands often using soap and water. If soap and water are not available, use a hand sanitizer. Others in the household should wash their hands as well. Hands should be washed: ? After using the toilet or changing a diaper. ? Before preparing, cooking, or serving food. ? While caring for a sick person or while visiting someone in a hospital.  Drink enough fluid to keep your urine pale yellow.  Rest at home while you recover.  Watch your condition for any changes.  Take a warm bath to relieve any burning or pain from frequent diarrhea episodes.  Keep all follow-up visits as told by your health care provider. This is important.   Contact a health care provider if:  You have a fever.  Your diarrhea gets worse.  You have new symptoms.  You cannot keep fluids down.  You feel light-headed or dizzy.  You have a headache.  You have muscle cramps. Get help right away if:  You have chest pain.  You feel extremely weak or you faint.  You have bloody or black stools or stools that look like tar.  You have severe pain, cramping, or bloating in your abdomen.  You have trouble breathing or you are breathing very quickly.  Your heart is  beating very quickly.  Your skin feels cold and clammy.  You feel confused.  You have signs of dehydration, such as: ? Dark urine, very little urine, or no urine. ? Cracked lips. ? Dry mouth. ? Sunken eyes. ? Sleepiness. ? Weakness. Summary  Diarrhea is frequent loose and watery bowel movements. Diarrhea can make you feel weak and cause you to become dehydrated.  Drink enough fluids to keep your urine pale yellow.  Make sure that you wash your hands after using the toilet. If soap and water are not available, use hand sanitizer.  Contact a health care provider if your diarrhea gets worse or you have new symptoms.  Get help right away if you have signs of dehydration. This information is not intended to replace advice given to you by your health care provider. Make sure you discuss any questions you have with your health care provider. Document Revised: 12/27/2018 Document Reviewed: 01/14/2018 Elsevier Patient Education  2021 Elsevier Inc.  Citalopram tablets What is this medicine? CITALOPRAM (sye TAL oh pram) is a medicine for depression. This medicine may be used for other purposes; ask your health care provider or pharmacist if you have questions. COMMON BRAND NAME(S): Celexa What should I tell my health care provider before I take this medicine? They need to know if you have any of these conditions:  bleeding disorders  bipolar disorder or a family history of bipolar disorder  glaucoma  heart disease  history of irregular heartbeat  kidney disease  liver disease  low levels of magnesium or potassium in the blood  receiving electroconvulsive therapy  seizures  suicidal thoughts, plans, or attempt; a previous suicide attempt by you or a family member  take medicines that treat or prevent blood clots  thyroid disease  an unusual or allergic reaction to citalopram, escitalopram, other medicines, foods, dyes, or preservatives  pregnant or trying to  become pregnant  breast-feeding How should I use this medicine? Take this medicine by mouth with a glass of water. Follow the directions on the prescription label. You can take it with  or without food. Take your medicine at regular intervals. Do not take your medicine more often than directed. Do not stop taking this medicine suddenly except upon the advice of your doctor. Stopping this medicine too quickly may cause serious side effects or your condition may worsen. A special MedGuide will be given to you by the pharmacist with each prescription and refill. Be sure to read this information carefully each time. Talk to your pediatrician regarding the use of this medicine in children. Special care may be needed. Patients over 71 years old may have a stronger reaction and need a smaller dose. Overdosage: If you think you have taken too much of this medicine contact a poison control center or emergency room at once. NOTE: This medicine is only for you. Do not share this medicine with others. What if I miss a dose? If you miss a dose, take it as soon as you can. If it is almost time for your next dose, take only that dose. Do not take double or extra doses. What may interact with this medicine? Do not take this medicine with any of the following medications:  certain medicines for fungal infections like fluconazole, itraconazole, ketoconazole, posaconazole, voriconazole  cisapride  dronedarone  escitalopram  linezolid  MAOIs like Carbex, Eldepryl, Marplan, Nardil, and Parnate  methylene blue (injected into a vein)  pimozide  thioridazine This medicine may also interact with the following medications:  alcohol  amphetamines  aspirin and aspirin-like medicines  carbamazepine  certain medicines for depression, anxiety, or psychotic disturbances  certain medicines for infections like chloroquine, clarithromycin, erythromycin, furazolidone, isoniazid, pentamidine  certain  medicines for migraine headaches like almotriptan, eletriptan, frovatriptan, naratriptan, rizatriptan, sumatriptan, zolmitriptan  certain medicines for sleep  certain medicines that treat or prevent blood clots like dalteparin, enoxaparin, warfarin  cimetidine  diuretics  dofetilide  fentanyl  lithium  methadone  metoprolol  NSAIDs, medicines for pain and inflammation, like ibuprofen or naproxen  omeprazole  other medicines that prolong the QT interval (cause an abnormal heart rhythm)  procarbazine  rasagiline  supplements like St. John's wort, kava kava, valerian  tramadol  tryptophan  ziprasidone This list may not describe all possible interactions. Give your health care provider a list of all the medicines, herbs, non-prescription drugs, or dietary supplements you use. Also tell them if you smoke, drink alcohol, or use illegal drugs. Some items may interact with your medicine. What should I watch for while using this medicine? Tell your doctor if your symptoms do not get better or if they get worse. Visit your doctor or health care professional for regular checks on your progress. Because it may take several weeks to see the full effects of this medicine, it is important to continue your treatment as prescribed by your doctor. Patients and their families should watch out for new or worsening thoughts of suicide or depression. Also watch out for sudden changes in feelings such as feeling anxious, agitated, panicky, irritable, hostile, aggressive, impulsive, severely restless, overly excited and hyperactive, or not being able to sleep. If this happens, especially at the beginning of treatment or after a change in dose, call your health care professional. Bonita Quin may get drowsy or dizzy. Do not drive, use machinery, or do anything that needs mental alertness until you know how this medicine affects you. Do not stand or sit up quickly, especially if you are an older patient. This  reduces the risk of dizzy or fainting spells. Alcohol may interfere with the effect of  this medicine. Avoid alcoholic drinks. Your mouth may get dry. Chewing sugarless gum or sucking hard candy, and drinking plenty of water will help. Contact your doctor if the problem does not go away or is severe. What side effects may I notice from receiving this medicine? Side effects that you should report to your doctor or health care professional as soon as possible:  allergic reactions like skin rash, itching or hives, swelling of the face, lips, or tongue  anxious  black, tarry stools  breathing problems  changes in vision  chest pain  confusion  elevated mood, decreased need for sleep, racing thoughts, impulsive behavior  eye pain  fast, irregular heartbeat  feeling faint or lightheaded, falls  feeling agitated, angry, or irritable  hallucination, loss of contact with reality  loss of balance or coordination  loss of memory  painful or prolonged erections  restlessness, pacing, inability to keep still  seizures  stiff muscles  suicidal thoughts or other mood changes  trouble sleeping  unusual bleeding or bruising  unusually weak or tired  vomiting Side effects that usually do not require medical attention (report to your doctor or health care professional if they continue or are bothersome):  change in appetite or weight  change in sex drive or performance  dizziness  headache  increased sweating  indigestion, nausea  tremors This list may not describe all possible side effects. Call your doctor for medical advice about side effects. You may report side effects to FDA at 1-800-FDA-1088. Where should I keep my medicine? Keep out of reach of children. Store at room temperature between 15 and 30 degrees C (59 and 86 degrees F). Throw away any unused medicine after the expiration date. NOTE: This sheet is a summary. It may not cover all possible  information. If you have questions about this medicine, talk to your doctor, pharmacist, or health care provider.  2021 Elsevier/Gold Standard (2018-08-01 09:05:36)

## 2021-01-15 NOTE — Progress Notes (Signed)
Chief Complaint  Patient presents with  . Annual Exam   Annual  1. Anxiety and depression gad 7 4 and phq 5 today due to world events anxiety c/w son being affected now  Will reach out to his ped. And her therapist  2. Asthma controlled on albuterol and singular needs refills  3diarrhea recently last week after eating [place] normally avoids dairy disc probiotics  4. Right neck swollen gland x 1 year not resolving  Review of Systems  Constitutional: Negative for weight loss.  HENT: Negative for hearing loss.   Eyes: Negative for blurred vision.  Respiratory: Negative for shortness of breath.   Cardiovascular: Negative for chest pain.  Gastrointestinal: Negative for abdominal pain.  Musculoskeletal: Negative for falls and joint pain.  Skin: Negative for rash.  Neurological: Negative for headaches.  Psychiatric/Behavioral: The patient is nervous/anxious.    Past Medical History:  Diagnosis Date  . Abnormal Pap smear 2004,2005   Hx of.  . Acid reflux   . Asthma    Induced w/ weather changes;inhaler prn  . COVID-19    04/2020 tired/h/a  . Migraines 2012  . Seizure ([place])    x 2 as a Toddler   Past Surgical History:  Procedure Laterality Date  . WISDOM TOOTH EXTRACTION  2007   all 4 removed   Family History  Problem Relation Age of Onset  . Stroke Maternal Grandfather   . Hypertension Maternal Grandfather   . Cancer Paternal Uncle        Throat  . Cancer Paternal Grandmother        Unsure which type  . Migraines Father   . Other Father        cardiac  amyloidosis  . Asthma Sister        x 2  . Asthma Cousin        Maternal  . Breast cancer Cousin   . Asthma Other        Nephew  . Kidney failure Maternal Uncle   . Lupus Sister   . Diverticulitis Mother   . Stroke Maternal Grandmother   . Cancer Paternal Uncle        stomach cancer   . Other Brother        covid 57    Social History   Socioeconomic History  . Marital status: Married    Spouse name: Not  on file  . Number of children: Not on file  . Years of education: Not on file  . Highest education level: Not on file  Occupational History  . Occupation: CSR    Employer: [place]  Tobacco Use  . Smoking status: Never Smoker  . Smokeless tobacco: Never Used  Substance and Sexual Activity  . Alcohol use: Yes    Alcohol/week: 2.0 standard drinks    Types: 2 Standard drinks or equivalent per week    Comment: socially   . Drug use: No  . Sexual activity: Yes    Partners: Male    Birth control/protection: Pill  Other Topics Concern  . Not on file  Social History Narrative   Married with children 2 children    She is middle child   Social Determinants of Radio broadcast assistant Strain: Not on file  Food Insecurity: Not on file  Transportation Needs: Not on file  Physical Activity: Not on file  Stress: Not on file  Social Connections: Not on file  Intimate Partner  Violence: Not on file   Current Meds  Medication Sig  . acetaminophen (TYLENOL) 500 MG tablet Take 1,000 mg by mouth every 6 (six) hours as needed for moderate pain or headache.  . citalopram (CELEXA) 10 MG tablet Take 1 tablet (10 mg total) by mouth daily.  . hydrocortisone 2.5 % ointment Apply topically 2 (two) times daily. Prn neck, face  . ibuprofen (ADVIL,MOTRIN) 600 MG tablet Take 1 tablet (600 mg total) by mouth every 6 (six) hours.  . Iron-FA-B Cmp-C-Biot-Probiotic (FUSION PLUS) CAPS Take 1 capsule by mouth daily.  Marland Kitchen ketoconazole (NIZORAL) 2 % shampoo Apply 1 application topically 2 (two) times a week. Let sit x 5 minutes then rinse  . Multiple Vitamin (MULTIVITAMIN) tablet Take 1 tablet by mouth daily.  Marland Kitchen triamcinolone ointment (KENALOG) 0.1 % Apply 1 application topically 2 (two) times daily. Neck prn  . [DISCONTINUED] albuterol (VENTOLIN HFA) 108 (90 Base) MCG/ACT inhaler Inhale 1-2 puffs into the lungs every 4 (four) hours as needed for wheezing or shortness of  breath. Rescue inhaler  . [DISCONTINUED] montelukast (SINGULAIR) 10 MG tablet Take 1 tablet (10 mg total) by mouth at bedtime.   No Known Allergies No results found for this or any previous visit (from the past 2160 hour(s)). Objective  Body mass index is 37.82 kg/m. Wt Readings from Last 3 Encounters:  01/15/21 214 lb 12.8 oz (97.4 kg)  03/19/20 200 lb (90.7 kg)  12/06/19 205 lb 6.4 oz (93.2 kg)   Temp Readings from Last 3 Encounters:  01/15/21 98.2 F (36.8 C) (Oral)  12/06/19 98.2 F (36.8 C) (Temporal)  05/18/18 98.6 F (37 C) (Oral)   BP Readings from Last 3 Encounters:  01/15/21 110/82  12/06/19 110/78  04/11/19 120/80   Pulse Readings from Last 3 Encounters:  01/15/21 69  12/06/19 82  04/11/19 73    Physical Exam Vitals and nursing note reviewed.  Constitutional:      Appearance: Normal appearance. She is well-developed and well-groomed. She is obese.  HENT:     Head: Normocephalic and atraumatic.  Eyes:     Conjunctiva/sclera: Conjunctivae normal.     Pupils: Pupils are equal, round, and reactive to light.  Cardiovascular:     Rate and Rhythm: Normal rate and regular rhythm.     Heart sounds: Normal heart sounds. No murmur heard.   Pulmonary:     Effort: Pulmonary effort is normal.     Breath sounds: Normal breath sounds.  Abdominal:     Tenderness: There is no abdominal tenderness.  Lymphadenopathy:     Cervical: Cervical adenopathy present.     Right cervical: Posterior cervical adenopathy present.  Skin:    General: Skin is warm and dry.  Neurological:     General: No focal deficit present.     Mental Status: She is alert and oriented to person, place, and time. Mental status is at baseline.     Gait: Gait normal.  Psychiatric:        Attention and Perception: Attention and perception normal.        Mood and Affect: Mood and affect normal.        Speech: Speech normal.        Behavior: Behavior normal. Behavior is cooperative.        Thought  Content: Thought content normal.        Cognition and Memory: Cognition and memory normal.        Judgment: Judgment normal.  Assessment  Plan  Annual physical exam - Plan: Urinalysis, Routine w reflex microscopic, TSH, CBC with Differential/Platelet, Lipid panel, Comprehensive metabolic panel Flu shot not had 2021 Tdap up to datenotes state had 05/27/17 and possibly 12/26/12 ob/gyn  pna 25 had 5/5/14consider repeatwith asthma hx Immune hep Band MMR covid vaccine 3/3 moderna disc booster  Pap had 02/2016 request records [facility] OB[facility] Dr. Benjie Karvonen -pap 04/20/18 neg neg HPV Saw ob/gyn 04/19/18 Dr. Alesia Richards did STD check and pap no results in notes. -requested pap  Saw [facility] Dr. Alesia Richards 05/16/19   Prior ob/gyn notes H/o anxiety and binge eating rec disc with PCP and checked STDS CT/NG/TV/HIV/HBsAg/RPR/HCV and yeast vaginitis  rec healthy diet and exercise   Vitamin D deficiency - Plan: Vitamin D (25 hydroxy)  Anxiety and depression - Plan: citalopram (CELEXA) 10 MG tablet F/u therapy  Exercise-induced/seasal asthma controlled- Plan: albuterol (VENTOLIN HFA) 108 (90 Base) MCG/ACT inhaler Mild intermittent asthma, unspecified whether complicated - Plan: montelukast (SINGULAIR) 10 MG tablet  Cervical lymphadenopathy - Plan: US Soft Tissue Head/Neck (NON-THYROID)  Provider: Dr. Olivia Mackie McLean-Scocuzza-Internal Medicine

## 2021-01-16 LAB — MICROSCOPIC MESSAGE

## 2021-01-16 LAB — URINALYSIS, ROUTINE W REFLEX MICROSCOPIC
Bilirubin Urine: NEGATIVE
Glucose, UA: NEGATIVE
Hyaline Cast: NONE SEEN /LPF
Ketones, ur: NEGATIVE
Nitrite: NEGATIVE
Protein, ur: NEGATIVE
RBC / HPF: NONE SEEN /HPF (ref 0–2)
Specific Gravity, Urine: 1.014 (ref 1.001–1.035)
pH: 6.5 (ref 5.0–8.0)

## 2021-01-17 ENCOUNTER — Telehealth: Payer: Self-pay

## 2021-01-17 NOTE — Telephone Encounter (Signed)
Called to ask patient if she had any urinary symptoms. Mailbox was full, could not leave a message.

## 2021-01-24 ENCOUNTER — Other Ambulatory Visit

## 2021-01-27 ENCOUNTER — Telehealth: Payer: Self-pay

## 2021-01-27 NOTE — Telephone Encounter (Signed)
Patient called for lab results.

## 2021-01-27 NOTE — Telephone Encounter (Signed)
Spoke with Patient this morning. Patient informed and verbalized understanding.

## 2021-01-29 ENCOUNTER — Ambulatory Visit
Admission: RE | Admit: 2021-01-29 | Discharge: 2021-01-29 | Disposition: A | Discharge status: Home / Self Care | Source: Ambulatory Visit | Attending: Internal Medicine | Admitting: Internal Medicine

## 2021-01-29 DIAGNOSIS — R590 Localized enlarged lymph nodes: Secondary | ICD-10-CM

## 2021-04-04 ENCOUNTER — Encounter: Payer: Self-pay | Admitting: Internal Medicine

## 2021-04-07 NOTE — Telephone Encounter (Signed)
For your information  

## 2021-07-22 ENCOUNTER — Ambulatory Visit (INDEPENDENT_AMBULATORY_CARE_PROVIDER_SITE_OTHER): Admitting: Internal Medicine

## 2021-07-22 ENCOUNTER — Other Ambulatory Visit: Payer: Self-pay

## 2021-07-22 ENCOUNTER — Encounter: Payer: Self-pay | Admitting: Internal Medicine

## 2021-07-22 VITALS — BP 122/80 | HR 79 | Temp 97.9°F | Ht 63.19 in | Wt 203.8 lb

## 2021-07-22 DIAGNOSIS — F419 Anxiety disorder, unspecified: Secondary | ICD-10-CM | POA: Diagnosis not present

## 2021-07-22 DIAGNOSIS — J45990 Exercise induced bronchospasm: Secondary | ICD-10-CM

## 2021-07-22 DIAGNOSIS — Z23 Encounter for immunization: Secondary | ICD-10-CM

## 2021-07-22 DIAGNOSIS — J4520 Mild intermittent asthma, uncomplicated: Secondary | ICD-10-CM

## 2021-07-22 DIAGNOSIS — Z0000 Encounter for general adult medical examination without abnormal findings: Secondary | ICD-10-CM

## 2021-07-22 DIAGNOSIS — F32A Depression, unspecified: Secondary | ICD-10-CM

## 2021-07-22 MED ORDER — CITALOPRAM HYDROBROMIDE 20 MG PO TABS: 20.0000 mg | ORAL_TABLET | Freq: Every day | ORAL | 3 refills | Status: DC

## 2021-07-22 MED ORDER — ALBUTEROL SULFATE HFA 108 (90 BASE) MCG/ACT IN AERS: 1.0000 | INHALATION_SPRAY | RESPIRATORY_TRACT | 12 refills | Status: DC | PRN

## 2021-07-22 MED ORDER — MONTELUKAST SODIUM 10 MG PO TABS: 10.0000 mg | ORAL_TABLET | Freq: Every day | ORAL | 3 refills | Status: DC

## 2021-07-22 NOTE — Patient Instructions (Addendum)
Biotene mouthwash  Consider moderna 4th dose then  Consider prevnar 13 vaccine  You can go to any [place] office 01/16/22 or after for fasting labs

## 2021-07-22 NOTE — Progress Notes (Signed)
Chief Complaint  Patient presents with   Follow-up   F/u  1. Anxiety and depression better on celexa 10 mg qd at times taking 2 pills daily but having dry mouth 2. Flu shot today  3. Consider prevnar and 4th dose covid in the future    Review of Systems  Constitutional:  Negative for weight loss.  HENT:  Negative for hearing loss.   Eyes:  Negative for blurred vision.  Respiratory:  Negative for shortness of breath.   Cardiovascular:  Negative for chest pain.  Gastrointestinal:  Negative for abdominal pain and blood in stool.  Genitourinary:  Negative for dysuria.  Musculoskeletal:  Negative for falls and joint pain.  Skin:  Negative for rash.  Neurological:  Negative for headaches.  Psychiatric/Behavioral:  Negative for depression.   Past Medical History:  Diagnosis Date   Abnormal Pap smear 2004,2005   Hx of.   Acid reflux    Asthma    Induced w/ weather changes;inhaler prn   COVID-19    04/2020 tired/h/a   Migraines 2012   Seizure ([facility])    x 2 as a Toddler   Past Surgical History:  Procedure Laterality Date   WISDOM TOOTH EXTRACTION  2007   all 4 removed   Family History  Problem Relation Age of Onset   Stroke Maternal Grandfather    Hypertension Maternal Grandfather    Cancer Paternal Uncle        Throat   Cancer Paternal Grandmother        Unsure which type   Migraines Father    Other Father        cardiac  amyloidosis   Asthma Sister        x 2   Asthma Cousin        Maternal   Breast cancer Cousin    Asthma Other        Nephew   Kidney failure Maternal Uncle    Lupus Sister    Diverticulitis Mother    Stroke Maternal Grandmother    Cancer Paternal Uncle        stomach cancer    Other Brother        covid 71    Social History   Socioeconomic History   Marital status: Married    Spouse name: Not on file   Number of children: Not on file   Years of education: Not on file   Highest education level: Not on file  Occupational History    Occupation: CSR    Employer: [employer]    Comment: [employer]  Tobacco Use   Smoking status: Never   Smokeless tobacco: Never  Substance and Sexual Activity   Alcohol use: Yes    Alcohol/week: 2.0 standard drinks    Types: 2 Standard drinks or equivalent per week    Comment: socially    Drug use: No   Sexual activity: Yes    Partners: Male    Birth control/protection: Pill  Other Topics Concern   Not on file  Social History Narrative   Married with children 2 children    She is middle child   Social Determinants of Corporate investment banker Strain: Not on file  Food Insecurity: Not on file  Transportation Needs: Not on file  Physical Activity: Not on file  Stress: Not on file  Social Connections: Not on file  Intimate Partner Violence: Not on file   Current Meds  Medication Sig  acetaminophen (TYLENOL) 500 MG tablet Take 1,000 mg by mouth every 6 (six) hours as needed for moderate pain or headache.   hydrocortisone 2.5 % ointment Apply topically 2 (two) times daily. Prn neck, face   ibuprofen (ADVIL,MOTRIN) 600 MG tablet Take 1 tablet (600 mg total) by mouth every 6 (six) hours.   Iron-FA-B Cmp-C-Biot-Probiotic (FUSION PLUS) CAPS Take 1 capsule by mouth daily.   ketoconazole (NIZORAL) 2 % shampoo Apply 1 application topically 2 (two) times a week. Let sit x 5 minutes then rinse   Multiple Vitamin (MULTIVITAMIN) tablet Take 1 tablet by mouth daily.   triamcinolone ointment (KENALOG) 0.1 % Apply 1 application topically 2 (two) times daily. Neck prn   [DISCONTINUED] albuterol (VENTOLIN HFA) 108 (90 Base) MCG/ACT inhaler Inhale 1-2 puffs into the lungs every 4 (four) hours as needed for wheezing or shortness of breath. Rescue inhaler   [DISCONTINUED] citalopram (CELEXA) 10 MG tablet Take 1 tablet (10 mg total) by mouth daily.   [DISCONTINUED] montelukast (SINGULAIR) 10 MG tablet Take 1 tablet (10 mg total) by mouth at bedtime.   No Known Allergies No results  found for this or any previous visit (from the past 2160 hour(s)). Objective  Body mass index is 35.88 kg/m. Wt Readings from Last 3 Encounters:  07/22/21 203 lb 12.8 oz (92.4 kg)  01/15/21 214 lb 12.8 oz (97.4 kg)  03/19/20 200 lb (90.7 kg)   Temp Readings from Last 3 Encounters:  07/22/21 97.9 F (36.6 C) (Temporal)  01/15/21 98.2 F (36.8 C) (Oral)  12/06/19 98.2 F (36.8 C) (Temporal)   BP Readings from Last 3 Encounters:  07/22/21 122/80  01/15/21 110/82  12/06/19 110/78   Pulse Readings from Last 3 Encounters:  07/22/21 79  01/15/21 69  12/06/19 82    Physical Exam Vitals and nursing note reviewed.  Constitutional:      Appearance: Normal appearance. She is well-developed and well-groomed.  HENT:     Head: Normocephalic and atraumatic.  Eyes:     Conjunctiva/sclera: Conjunctivae normal.     Pupils: Pupils are equal, round, and reactive to light.  Cardiovascular:     Rate and Rhythm: Normal rate and regular rhythm.     Heart sounds: Normal heart sounds. No murmur heard. Pulmonary:     Effort: Pulmonary effort is normal.     Breath sounds: Normal breath sounds.  Abdominal:     General: Abdomen is flat. Bowel sounds are normal.     Tenderness: There is no abdominal tenderness.  Musculoskeletal:        General: No tenderness.  Skin:    General: Skin is warm and dry.  Neurological:     General: No focal deficit present.     Mental Status: She is alert and oriented to person, place, and time. Mental status is at baseline.     Cranial Nerves: Cranial nerves 2-12 are intact.     Gait: Gait is intact.  Psychiatric:        Attention and Perception: Attention and perception normal.        Mood and Affect: Mood and affect normal.        Speech: Speech normal.        Behavior: Behavior normal. Behavior is cooperative.        Thought Content: Thought content normal.        Cognition and Memory: Cognition and memory normal.        Judgment: Judgment normal.     Assessment  Plan  Anxiety and depression - Plan: citalopram (CELEXA) 20 MG tablet Disc biotene for dry mouth   Mild intermittent asthma, unspecified whether complicated - Plan: montelukast (SINGULAIR) 10 MG tablet Exercise-induced asthma - Plan: albuterol (VENTOLIN HFA) 108 (90 Base) MCG/ACT inhaler  HM Flu shot given today Tdap up to date notes state had 05/27/17 and possibly 12/26/12  ob/gyn  pna 23 had 12/26/12 consider repeat with asthma hx  Consider prevnar  Immune hep B and MMR covid vaccine 3/3 moderna disc booster   Pap had 02/2016 request records [facility] OB[facility] Dr. Benjie Karvonen   -pap 04/20/18 neg neg HPV Saw ob/gyn 04/19/18 Dr. Alesia Richards did STD check and pap no results in notes.  -requested pap had 05/2021 roi  Saw [facility] Dr. Alesia Richards 05/16/19    Prior ob/gyn notes H/o anxiety and binge eating rec disc with PCP and checked STDS CT/NG/TV/HIV/HBsAg/RPR/HCV and yeast vaginitis    rec healthy diet and exercise     Provider: Dr. Olivia Mackie McLean-Scocuzza-Internal Medicine

## 2021-07-31 ENCOUNTER — Telehealth: Payer: Self-pay | Admitting: Internal Medicine

## 2021-07-31 ENCOUNTER — Encounter: Payer: Self-pay | Admitting: Internal Medicine

## 2021-07-31 NOTE — Telephone Encounter (Signed)
Last pap 04/19/18 call [facility] [place] [facility] to schedule another  guidelines states every 3-5 years me personally going every 3 years

## 2021-12-20 IMAGING — MG DIGITAL DIAGNOSTIC BILAT W/ TOMO W/ CAD
8 series · 8 of 24 positions shown · non-contrast
Comparison: None.

CLINICAL DATA: Patient presents complaining of waxing and waning
bilateral breast pain for 3 weeks with some shooting right sided
nipple pain as well.No reported lumps.

EXAM:
DIGITAL DIAGNOSTIC BILATERAL MAMMOGRAM WITH CAD AND TOMO

[L CC synth-2D]
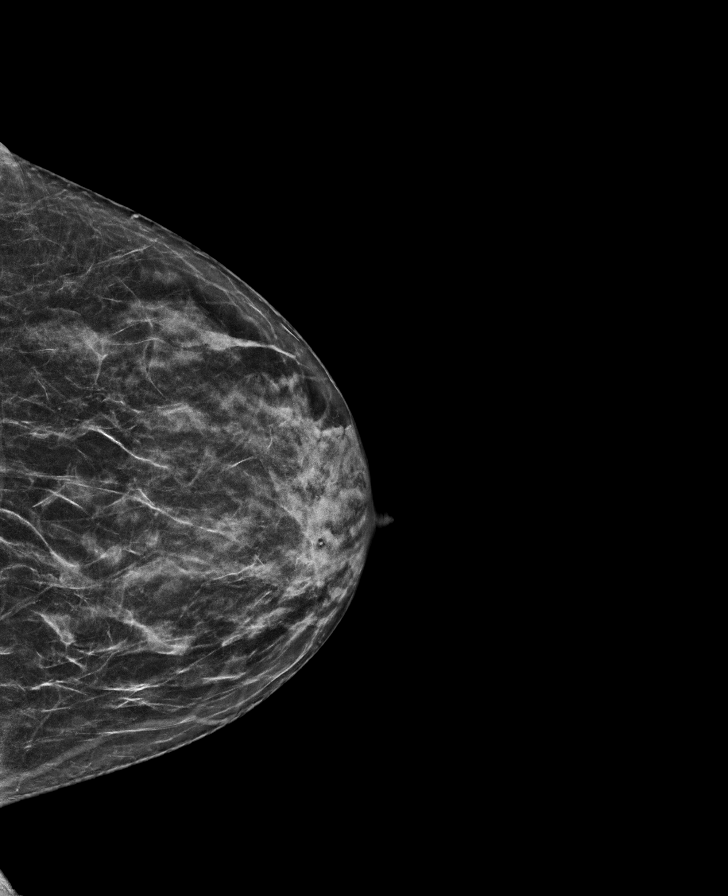

[R CC synth-2D]
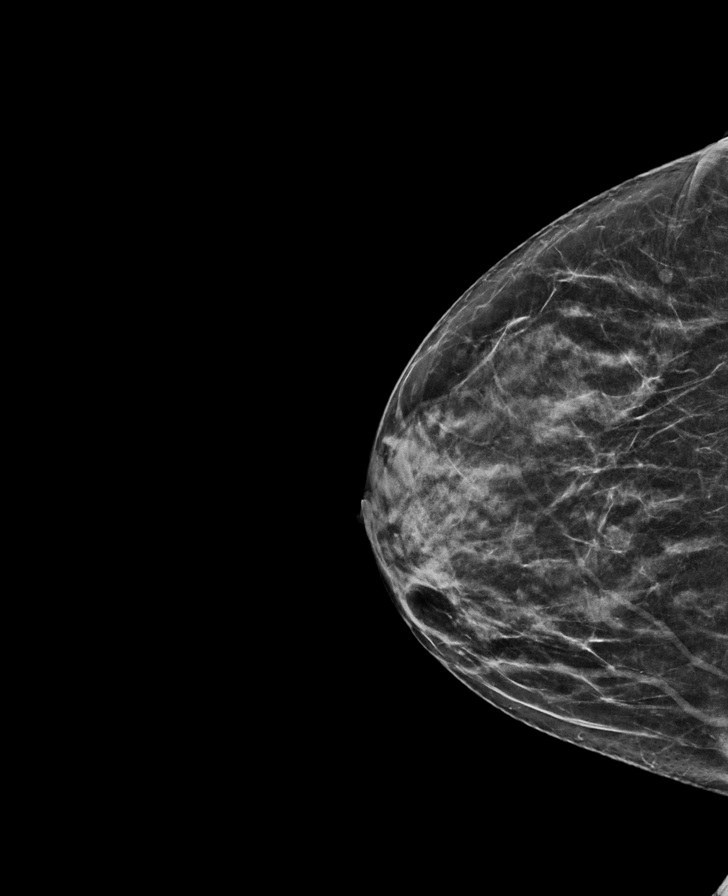

[L MLO synth-2D]
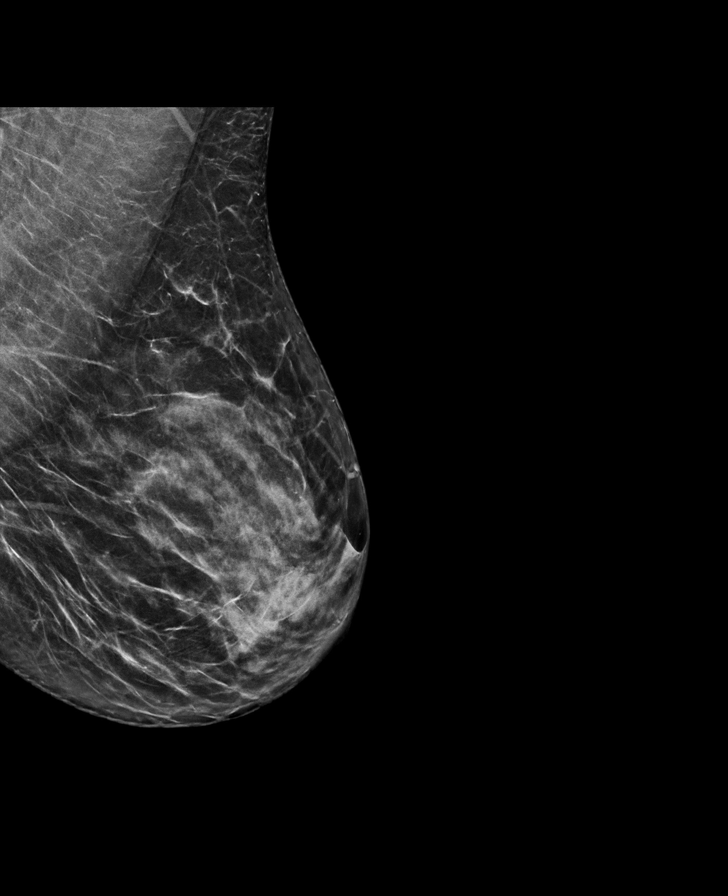

[R MLO synth-2D]
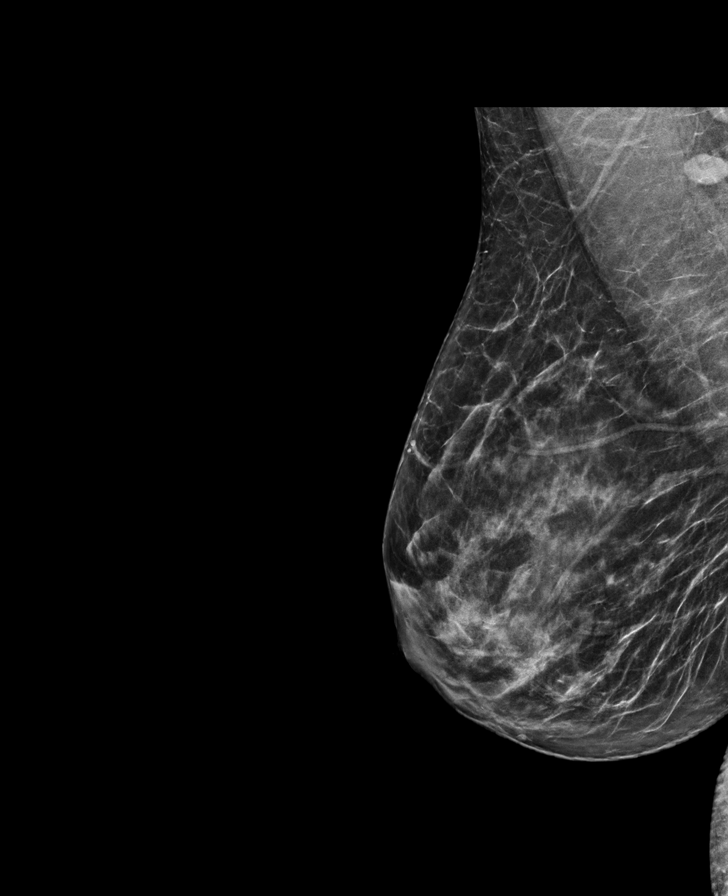

[R CC tomo · tomo slice 33/64.0]
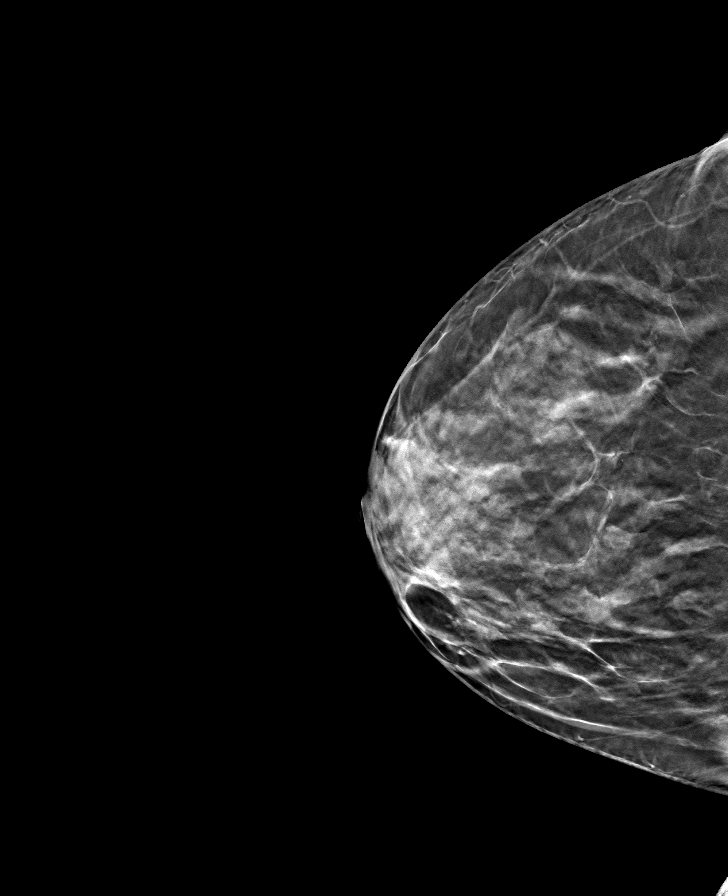

[R MLO tomo · tomo slice 35/70.0]
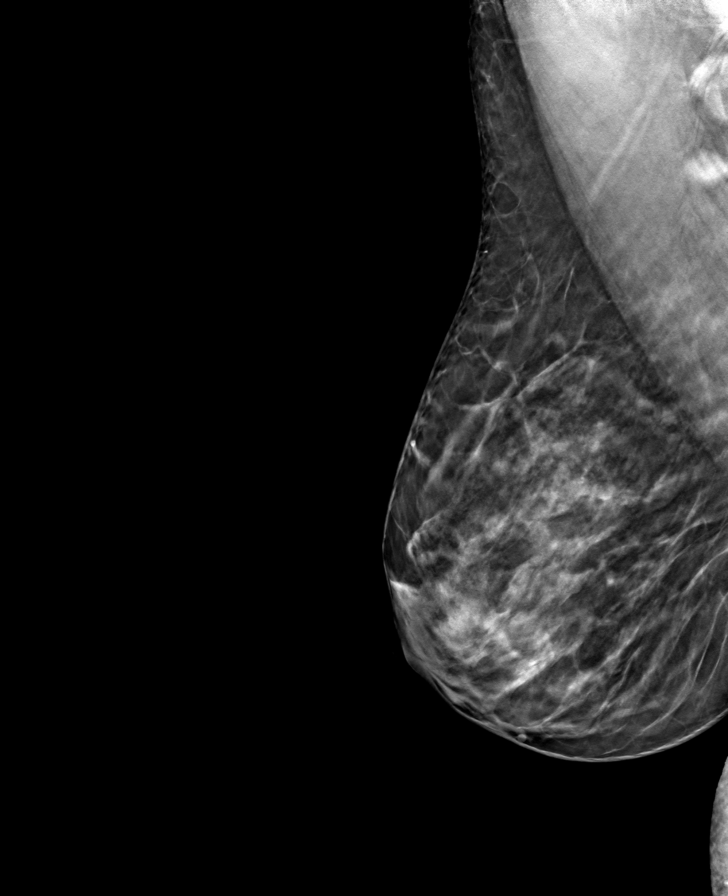

[L MLO tomo · tomo slice 37/72.0]
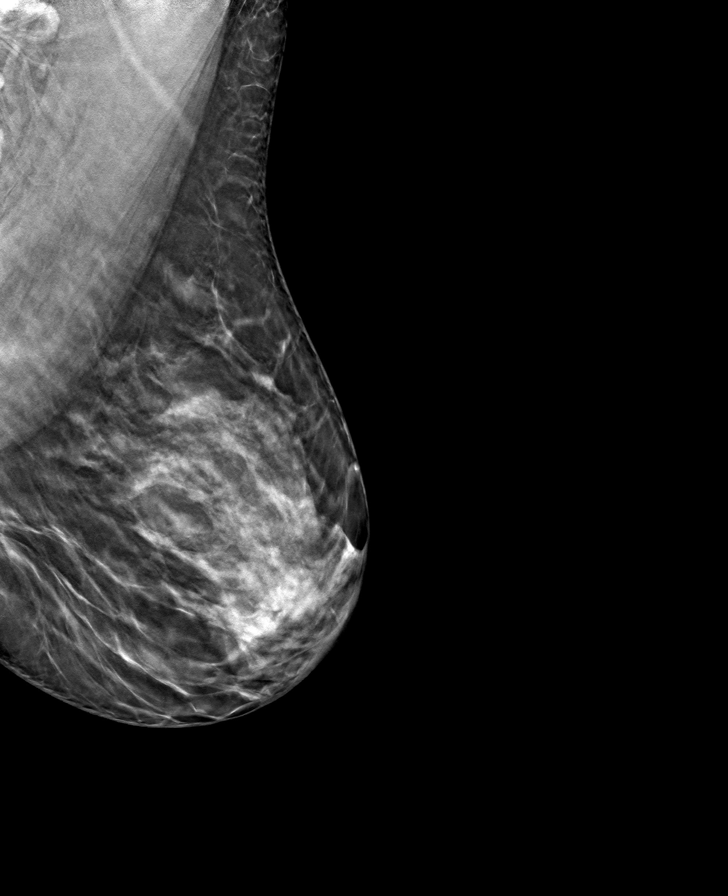

[L CC tomo · tomo slice 32/63.0]
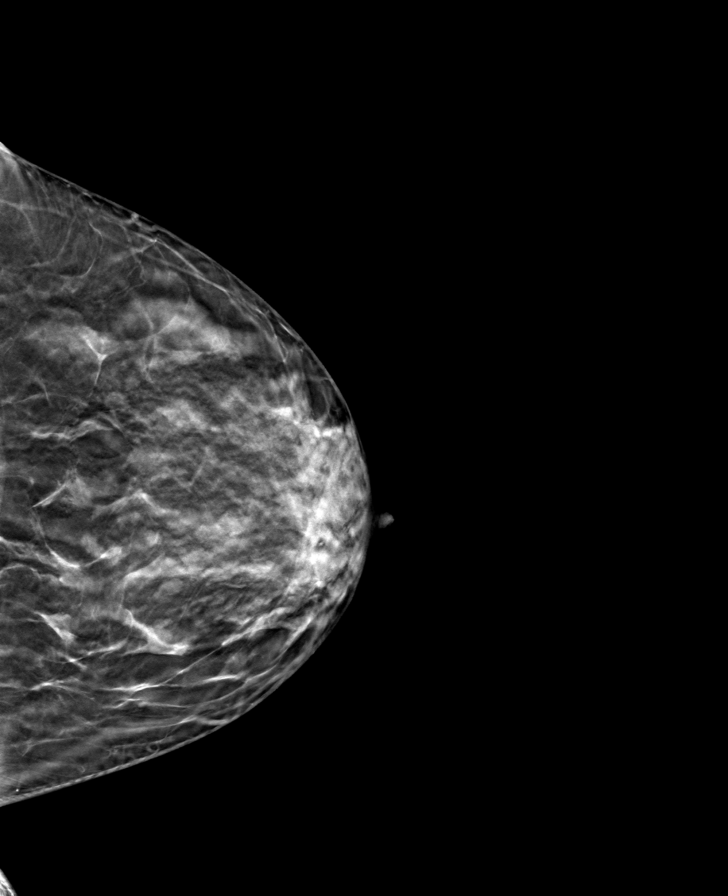

[8 of 24 positions shown; findings below may reference images not displayed]

ACR Breast Density Category c: The breast tissue is heterogeneously
dense, which may obscure small masses.
FINDINGS: There are no masses, areas of architectural distortion, areas of
significant asymmetry or suspicious calcifications.

Mammographic images were processed with CAD.
IMPRESSION: Negative exam.  No evidence of breast malignancy.

RECOMMENDATION:
Screening mammogram at age 42 unless there are persistent or
intervening clinical concerns. (Code:CA-W-SGL)

I have discussed the findings and recommendations with the patient.
If applicable, a reminder letter will be sent to the patient
regarding the next appointment.

BI-RADS CATEGORY  1: Negative.

## 2022-01-20 ENCOUNTER — Ambulatory Visit (INDEPENDENT_AMBULATORY_CARE_PROVIDER_SITE_OTHER): Payer: No Typology Code available for payment source | Admitting: Internal Medicine

## 2022-01-20 ENCOUNTER — Encounter: Payer: Self-pay | Admitting: Internal Medicine

## 2022-01-20 VITALS — BP 130/80 | HR 75 | Temp 98.3°F | Resp 14 | Ht 63.19 in | Wt 209.0 lb

## 2022-01-20 DIAGNOSIS — Z1389 Encounter for screening for other disorder: Secondary | ICD-10-CM

## 2022-01-20 DIAGNOSIS — Z0000 Encounter for general adult medical examination without abnormal findings: Secondary | ICD-10-CM

## 2022-01-20 DIAGNOSIS — Z1329 Encounter for screening for other suspected endocrine disorder: Secondary | ICD-10-CM | POA: Diagnosis not present

## 2022-01-20 DIAGNOSIS — Z13220 Encounter for screening for lipoid disorders: Secondary | ICD-10-CM | POA: Diagnosis not present

## 2022-01-20 DIAGNOSIS — E785 Hyperlipidemia, unspecified: Secondary | ICD-10-CM

## 2022-01-20 DIAGNOSIS — J45990 Exercise induced bronchospasm: Secondary | ICD-10-CM

## 2022-01-20 DIAGNOSIS — E559 Vitamin D deficiency, unspecified: Secondary | ICD-10-CM

## 2022-01-20 DIAGNOSIS — J4520 Mild intermittent asthma, uncomplicated: Secondary | ICD-10-CM

## 2022-01-20 DIAGNOSIS — Z6836 Body mass index (BMI) 36.0-36.9, adult: Secondary | ICD-10-CM | POA: Insufficient documentation

## 2022-01-20 DIAGNOSIS — J309 Allergic rhinitis, unspecified: Secondary | ICD-10-CM

## 2022-01-20 MED ORDER — WEGOVY 0.5 MG/0.5ML ~~LOC~~ SOAJ: 0.5000 mg | SUBCUTANEOUS | 0 refills | Status: DC

## 2022-01-20 MED ORDER — ALBUTEROL SULFATE HFA 108 (90 BASE) MCG/ACT IN AERS: 1.0000 | INHALATION_SPRAY | RESPIRATORY_TRACT | 12 refills | Status: AC | PRN

## 2022-01-20 MED ORDER — LEVOCETIRIZINE DIHYDROCHLORIDE 5 MG PO TABS: 5.0000 mg | ORAL_TABLET | Freq: Every evening | ORAL | 3 refills | Status: AC

## 2022-01-20 MED ORDER — WEGOVY 2.4 MG/0.75ML ~~LOC~~ SOAJ: 2.4000 mg | SUBCUTANEOUS | 5 refills | Status: DC

## 2022-01-20 MED ORDER — MONTELUKAST SODIUM 10 MG PO TABS: 10.0000 mg | ORAL_TABLET | Freq: Every day | ORAL | 3 refills | Status: AC

## 2022-01-20 MED ORDER — WEGOVY 0.25 MG/0.5ML ~~LOC~~ SOAJ: 0.2500 mg | SUBCUTANEOUS | 0 refills | Status: DC

## 2022-01-20 MED ORDER — WEGOVY 1 MG/0.5ML ~~LOC~~ SOAJ: 1.0000 mg | SUBCUTANEOUS | 0 refills | Status: DC

## 2022-01-20 MED ORDER — WEGOVY 1.7 MG/0.75ML ~~LOC~~ SOAJ: 1.7000 mg | SUBCUTANEOUS | 0 refills | Status: DC

## 2022-01-20 NOTE — Patient Instructions (Addendum)
For dementia patients sometimes will give Ativan at night to sleep or agitation, Seroquel, Risperdone  Semaglutide Injection (Weight Management) What is this medication? SEMAGLUTIDE (SEM a GLOO tide) promotes weight loss. It may also be used to maintain weight loss. It works by decreasing appetite. Changes to diet and exercise are often combined with this medication. This medicine may be used for other purposes; ask your health care provider or pharmacist if you have questions. COMMON BRAND NAME(S): HBZJIR What should I tell my care team before I take this medication? They need to know if you have any of these conditions: Endocrine tumors (MEN 2) or if someone in your family had these tumors Eye disease, vision problems Gallbladder disease History of depression or mental health disease History of pancreatitis Kidney disease Stomach or intestine problems Suicidal thoughts, plans, or attempt; a previous suicide attempt by you or a family member Thyroid cancer or if someone in your family had thyroid cancer An unusual or allergic reaction to semaglutide, other medications, foods, dyes, or preservatives Pregnant or trying to get pregnant Breast-feeding How should I use this medication? This medication is injected under the skin. You will be taught how to prepare and give it. Take it as directed on the prescription label. It is given once every week (every 7 days). Keep taking it unless your care team tells you to stop. It is important that you put your used needles and pens in a special sharps container. Do not put them in a trash can. If you do not have a sharps container, call your pharmacist or care team to get one. A special MedGuide will be given to you by the pharmacist with each prescription and refill. Be sure to read this information carefully each time. This medication comes with INSTRUCTIONS FOR USE. Ask your pharmacist for directions on how to use this medication. Read the  information carefully. Talk to your pharmacist or care team if you have questions. Talk to your care team about the use of this medication in children. While it may be prescribed for children as young as 12 years for selected conditions, precautions do apply. Overdosage: If you think you have taken too much of this medicine contact a poison control center or emergency room at once. NOTE: This medicine is only for you. Do not share this medicine with others. What if I miss a dose? If you miss a dose and the next scheduled dose is more than 2 days away, take the missed dose as soon as possible. If you miss a dose and the next scheduled dose is less than 2 days away, do not take the missed dose. Take the next dose at your regular time. Do not take double or extra doses. If you miss your dose for 2 weeks or more, take the next dose at your regular time or call your care team to talk about how to restart this medication. What may interact with this medication? Insulin and other medications for diabetes This list may not describe all possible interactions. Give your health care provider a list of all the medicines, herbs, non-prescription drugs, or dietary supplements you use. Also tell them if you smoke, drink alcohol, or use illegal drugs. Some items may interact with your medicine. What should I watch for while using this medication? Visit your care team for regular checks on your progress. It may be some time before you see the benefit from this medication. Drink plenty of fluids while taking this medication. Check with  your care team if you have severe diarrhea, nausea, and vomiting, or if you sweat a lot. The loss of too much body fluid may make it dangerous for you to take this medication. This medication may affect blood sugar levels. Ask your care team if changes in diet or medications are needed if you have diabetes. If you or your family notice any changes in your behavior, such as new or worsening  depression, thoughts of harming yourself, anxiety, other unusual or disturbing thoughts, or memory loss, call your care team right away. Women should inform their care team if they wish to become pregnant or think they might be pregnant. Losing weight while pregnant is not advised and may cause harm to the unborn child. Talk to your care team for more information. What side effects may I notice from receiving this medication? Side effects that you should report to your care team as soon as possible: Allergic reactions--skin rash, itching, hives, swelling of the face, lips, tongue, or throat Change in vision Dehydration--increased thirst, dry mouth, feeling faint or lightheaded, headache, dark yellow or brown urine Gallbladder problems--severe stomach pain, nausea, vomiting, fever Heart palpitations--rapid, pounding, or irregular heartbeat Kidney injury--decrease in the amount of urine, swelling of the ankles, hands, or feet Pancreatitis--severe stomach pain that spreads to your back or gets worse after eating or when touched, fever, nausea, vomiting Thoughts of suicide or self-harm, worsening mood, feelings of depression Thyroid cancer--new mass or lump in the neck, pain or trouble swallowing, trouble breathing, hoarseness Side effects that usually do not require medical attention (report to your care team if they continue or are bothersome): Diarrhea Loss of appetite Nausea Stomach pain Vomiting This list may not describe all possible side effects. Call your doctor for medical advice about side effects. You may report side effects to FDA at 1-800-FDA-1088. Where should I keep my medication? Keep out of the reach of children and pets. Refrigeration (preferred): Store in the refrigerator. Do not freeze. Keep this medication in the original container until you are ready to take it. Get rid of any unused medication after the expiration date. Room temperature: If needed, prior to cap removal, the  pen can be stored at room temperature for up to 28 days. Protect from light. If it is stored at room temperature, get rid of any unused medication after 28 days or after it expires, whichever is first. It is important to get rid of the medication as soon as you no longer need it or it is expired. You can do this in two ways: Take the medication to a medication take-back program. Check with your pharmacy or law enforcement to find a location. If you cannot return the medication, follow the directions in the MedGuide. NOTE: This sheet is a summary. It may not cover all possible information. If you have questions about this medicine, talk to your doctor, pharmacist, or health care provider.  2023 Elsevier/Gold Standard (2021-08-27 00:00:00)

## 2022-01-20 NOTE — Progress Notes (Signed)
Chief Complaint  Patient presents with   Annual Exam    Fasting? Had 1 piece of chip today, denies any pain   Annual 1 stress due to mom is at [facility] in [place] and younger sister who is 39 y.o nurse and has HCPOA wants her mother to come to [place] to live but mom has always lived [place]. Her mother Bartolo Darter had behavioral changes with dementia so she had to find somewhere to go she was wandering outside of her home in the middle of the night, not recognizing she nor her husband and thought her children which are her moms grankids her mother though they were her kids  She stopped celexa 20 mg month sago and trying to walk 2 miles total daily  2.obesity interested in weight loss medication disc wegovy she does binge eat with stress  3. Asthma controlled with albuterol and singulair at times has some wheezing and tried otc zyrtec and claritin wheezing worse at night Weather change is a trigger   Review of Systems  Constitutional:  Negative for weight loss.  HENT:  Negative for hearing loss.   Eyes:  Negative for blurred vision.  Respiratory:  Positive for wheezing. Negative for shortness of breath.   Cardiovascular:  Negative for chest pain.  Gastrointestinal:  Negative for abdominal pain and blood in stool.  Genitourinary:  Negative for dysuria.  Musculoskeletal:  Negative for falls and joint pain.  Skin:  Negative for rash.  Neurological:  Negative for headaches.  Psychiatric/Behavioral:  Negative for depression.   Past Medical History:  Diagnosis Date   Abnormal Pap smear 2004,2005   Hx of.   Acid reflux    Asthma    Induced w/ weather changes;inhaler prn   COVID-19    04/2020 tired/h/a   Migraines 2012   Seizure ([place])    x 2 as a Toddler   Past Surgical History:  Procedure Laterality Date   WISDOM TOOTH EXTRACTION  2007   all 4 removed   Family History  Problem Relation Age of Onset   Stroke Maternal  Grandfather    Hypertension Maternal Grandfather    Cancer Paternal Uncle        Throat   Cancer Paternal Grandmother        Unsure which type   Migraines Father    Other Father        cardiac  amyloidosis   Asthma Sister        x 2   Asthma Cousin        Maternal   Breast cancer Cousin    Asthma Other        Nephew   Kidney failure Maternal Uncle    Lupus Sister    Diverticulitis Mother    Stroke Maternal Grandmother    Cancer Paternal Uncle        stomach cancer    Other Brother        covid 36    Social History   Socioeconomic History   Marital status: Married    Spouse name: Not on file   Number of children: Not on file   Years of education: Not on file   Highest education level: Not on file  Occupational History   Occupation: CSR    Employer: [employer]    Comment: [employer]  Tobacco Use   Smoking status: Never   Smokeless tobacco: Never  Substance  and Sexual Activity   Alcohol use: Yes    Alcohol/week: 2.0 standard drinks    Types: 2 Standard drinks or equivalent per week    Comment: socially    Drug use: No   Sexual activity: Yes    Partners: Male    Birth control/protection: Pill  Other Topics Concern   Not on file  Social History Narrative   Married with children 2 children    She is middle child   Social Determinants of Radio broadcast assistant Strain: Not on file  Food Insecurity: Not on file  Transportation Needs: Not on file  Physical Activity: Not on file  Stress: Not on file  Social Connections: Not on file  Intimate Partner Violence: Not on file   Current Meds  Medication Sig   acetaminophen (TYLENOL) 500 MG tablet Take 1,000 mg by mouth every 6 (six) hours as needed for moderate pain or headache.   hydrocortisone 2.5 % ointment Apply topically 2 (two) times daily. Prn neck, face   ibuprofen (ADVIL,MOTRIN) 600 MG tablet Take 1 tablet (600 mg total) by mouth every 6 (six) hours.   Iron-FA-B Cmp-C-Biot-Probiotic (FUSION  PLUS) CAPS Take 1 capsule by mouth daily.   ketoconazole (NIZORAL) 2 % shampoo Apply 1 application topically 2 (two) times a week. Let sit x 5 minutes then rinse   levocetirizine (XYZAL) 5 MG tablet Take 1 tablet (5 mg total) by mouth every evening.   Multiple Vitamin (MULTIVITAMIN) tablet Take 1 tablet by mouth daily.   Semaglutide-Weight Management (WEGOVY) 0.25 MG/0.5ML SOAJ Inject 0.25 mg into the skin once a week.   Semaglutide-Weight Management (WEGOVY) 0.5 MG/0.5ML SOAJ Inject 0.5 mg into the skin once a week.   Semaglutide-Weight Management (WEGOVY) 1 MG/0.5ML SOAJ Inject 1 mg into the skin once a week.   Semaglutide-Weight Management (WEGOVY) 1.7 MG/0.75ML SOAJ Inject 1.7 mg into the skin once a week.   Semaglutide-Weight Management (WEGOVY) 2.4 MG/0.75ML SOAJ Inject 2.4 mg into the skin once a week.   triamcinolone ointment (KENALOG) 0.1 % Apply 1 application topically 2 (two) times daily. Neck prn   [DISCONTINUED] albuterol (VENTOLIN HFA) 108 (90 Base) MCG/ACT inhaler Inhale 1-2 puffs into the lungs every 4 (four) hours as needed for wheezing or shortness of breath. Rescue inhaler   [DISCONTINUED] montelukast (SINGULAIR) 10 MG tablet Take 1 tablet (10 mg total) by mouth at bedtime.   No Known Allergies No results found for this or any previous visit (from the past 2160 hour(s)). Objective  Body mass index is 36.8 kg/m. Wt Readings from Last 3 Encounters:  01/20/22 209 lb (94.8 kg)  07/22/21 203 lb 12.8 oz (92.4 kg)  01/15/21 214 lb 12.8 oz (97.4 kg)   Temp Readings from Last 3 Encounters:  01/20/22 98.3 F (36.8 C) (Oral)  07/22/21 97.9 F (36.6 C) (Temporal)  01/15/21 98.2 F (36.8 C) (Oral)   BP Readings from Last 3 Encounters:  01/20/22 130/80  07/22/21 122/80  01/15/21 110/82   Pulse Readings from Last 3 Encounters:  01/20/22 75  07/22/21 79  01/15/21 69    Physical Exam Vitals and nursing note reviewed.  Constitutional:      Appearance: Normal appearance.  She is well-developed and well-groomed.  HENT:     Head: Normocephalic and atraumatic.  Eyes:     Conjunctiva/sclera: Conjunctivae normal.     Pupils: Pupils are equal, round, and reactive to light.  Cardiovascular:     Rate and Rhythm: Normal rate and regular  rhythm.     Heart sounds: Normal heart sounds. No murmur heard. Pulmonary:     Effort: Pulmonary effort is normal.     Breath sounds: Normal breath sounds.  Abdominal:     General: Abdomen is flat. Bowel sounds are normal.     Tenderness: There is no abdominal tenderness.  Musculoskeletal:        General: No tenderness.  Skin:    General: Skin is warm and dry.  Neurological:     General: No focal deficit present.     Mental Status: She is alert and oriented to person, place, and time. Mental status is at baseline.     Cranial Nerves: Cranial nerves 2-12 are intact.     Motor: Motor function is intact.     Coordination: Coordination is intact.     Gait: Gait is intact.  Psychiatric:        Attention and Perception: Attention and perception normal.        Mood and Affect: Mood and affect normal.        Speech: Speech normal.        Behavior: Behavior normal. Behavior is cooperative.        Thought Content: Thought content normal.        Cognition and Memory: Cognition and memory normal.        Judgment: Judgment normal.    Assessment  Plan  Annual physical exam - Plan: Comprehensive metabolic panel, Lipid panel, CBC w/Diff, TSH, Urinalysis, Routine w reflex microscopic, Vitamin D (25 hydroxy) See below  Mild intermittent asthma, unspecified whether complicated - Plan: montelukast (SINGULAIR) 10 MG tablet Exercise-induced asthma - Plan: albuterol (VENTOLIN HFA) 108 (90 Base) MCG/ACT inhaler Allergic rhinitis, unspecified seasonality, unspecified trigger - Plan: levocetirizine (XYZAL) 5 MG tablet  BMI 36.0-36.9,adult - Plan: Semaglutide-Weight Management (WEGOVY) 0.25 MG/0.5ML SOAJ, Semaglutide-Weight Management  (WEGOVY) 0.5 MG/0.5ML SOAJ, Semaglutide-Weight Management (WEGOVY) 1 MG/0.5ML SOAJ, Semaglutide-Weight Management (WEGOVY) 1.7 MG/0.75ML SOAJ, Semaglutide-Weight Management (WEGOVY) 2.4 MG/0.75ML SOAJ  Hyperlipidemia rec healthy diet and exercise   HM Flu shot utd Tdap up to date notes state had 05/27/17 and possibly 12/26/12  ob/gyn  pna 23 had 12/26/12 consider repeat with asthma hx  Consider prevnar 20 in the future Immune hep B and MMR covid vaccine 3/3 moderna disc booster   Pap had 02/2016 request records [facility] Dr. Benjie Karvonen   -pap 04/20/18 neg neg HPV Saw ob/gyn 04/19/18 Dr. Alesia Richards did STD check and pap no results in notes. Saw [facility] Dr. Alesia Richards 05/16/19  -requested pap had 05/2021 roi sent today 01/20/21     Prior ob/gyn notes H/o anxiety and binge eating rec disc with PCP and checked STDS CT/NG/TV/HIV/HBsAg/RPR/HCV and yeast vaginitis    rec healthy diet and exercise   Provider: Dr. Olivia Mackie McLean-Scocuzza-Internal Medicine

## 2022-01-21 ENCOUNTER — Telehealth: Payer: Self-pay

## 2022-01-21 LAB — COMPREHENSIVE METABOLIC PANEL
ALT: 18 U/L (ref 0–35)
AST: 18 U/L (ref 0–37)
Albumin: 4.4 g/dL (ref 3.5–5.2)
Alkaline Phosphatase: 57 U/L (ref 39–117)
BUN: 9 mg/dL (ref 6–23)
CO2: 25 mEq/L (ref 19–32)
Calcium: 9.4 mg/dL (ref 8.4–10.5)
Chloride: 102 mEq/L (ref 96–112)
Creatinine, Ser: 0.91 mg/dL (ref 0.40–1.20)
GFR: 80.04 mL/min (ref >60.00)
Glucose, Bld: 77 mg/dL (ref 70–99)
Potassium: 3.3 mEq/L — ABNORMAL LOW (ref 3.5–5.1)
Sodium: 138 mEq/L (ref 135–145)
Total Bilirubin: 0.7 mg/dL (ref 0.2–1.2)
Total Protein: 8 g/dL (ref 6.0–8.3)

## 2022-01-21 LAB — CBC WITH DIFFERENTIAL/PLATELET
Basophils Absolute: 0.1 10*3/uL (ref 0.0–0.1)
Basophils Relative: 1.3 % (ref 0.0–3.0)
Eosinophils Absolute: 0.2 10*3/uL (ref 0.0–0.7)
Eosinophils Relative: 3.6 % (ref 0.0–5.0)
HCT: 38.9 % (ref 36.0–46.0)
Hemoglobin: 12.5 g/dL (ref 12.0–15.0)
Lymphocytes Relative: 39.5 % (ref 12.0–46.0)
Lymphs Abs: 2.6 10*3/uL (ref 0.7–4.0)
MCHC: 32.1 g/dL (ref 30.0–36.0)
MCV: 83.1 fl (ref 78.0–100.0)
Monocytes Absolute: 0.7 10*3/uL (ref 0.1–1.0)
Monocytes Relative: 10.3 % (ref 3.0–12.0)
Neutro Abs: 3 10*3/uL (ref 1.4–7.7)
Neutrophils Relative %: 45.3 % (ref 43.0–77.0)
Platelets: 230 10*3/uL (ref 150.0–400.0)
RBC: 4.68 Mil/uL (ref 3.87–5.11)
RDW: 13.7 % (ref 11.5–15.5)
WBC: 6.6 10*3/uL (ref 4.0–10.5)

## 2022-01-21 LAB — LIPID PANEL
Cholesterol: 166 mg/dL (ref 0–200)
HDL: 59.8 mg/dL (ref >39.00)
LDL Cholesterol: 96 mg/dL (ref 0–99)
NonHDL: 105.98
Total CHOL/HDL Ratio: 3
Triglycerides: 49 mg/dL (ref 0.0–149.0)
VLDL: 9.8 mg/dL (ref 0.0–40.0)

## 2022-01-21 LAB — URINALYSIS, ROUTINE W REFLEX MICROSCOPIC
Bilirubin Urine: NEGATIVE
Glucose, UA: NEGATIVE
Hyaline Cast: NONE SEEN /LPF
Ketones, ur: NEGATIVE
Nitrite: NEGATIVE
Protein, ur: NEGATIVE
Specific Gravity, Urine: 1.022 (ref 1.001–1.035)
pH: 5.5 (ref 5.0–8.0)

## 2022-01-21 LAB — TSH: TSH: 1.1 u[IU]/mL (ref 0.35–5.50)

## 2022-01-21 LAB — VITAMIN D 25 HYDROXY (VIT D DEFICIENCY, FRACTURES): VITD: 38.93 ng/mL (ref 30.00–100.00)

## 2022-01-21 NOTE — Telephone Encounter (Signed)
Lvm for pt to return call in regards to labs.  Per Dr.Tracy: Potassium slightly low  Please mail high potassium food list  +blood in urine was she on her cycle? Any uti symptoms?  Cholesterol improved  Blood cts normal  Thyroid lab normal  Vitamin D normal continue taking vitamin D

## 2022-01-21 NOTE — Telephone Encounter (Signed)
Patient returned office phone call. Note was read from Dr French Ana. Patient will look up foods that are high in potassium. No UTI symptoms, patient is starting her monthly cycle.

## 2022-01-29 ENCOUNTER — Encounter: Payer: Self-pay | Admitting: Internal Medicine

## 2022-01-29 ENCOUNTER — Telehealth: Payer: Self-pay

## 2022-01-29 NOTE — Telephone Encounter (Signed)
PA has been started via covermymeds on 01/29/22 for pt's Semaglutide-Weight Management (WEGOVY) 0.25 MG/0.5ML SOAJ    Key:[id number] Rx [id number]  Awaiting denial or approval

## 2022-09-14 ENCOUNTER — Ambulatory Visit (INDEPENDENT_AMBULATORY_CARE_PROVIDER_SITE_OTHER): Payer: Commercial Managed Care - PPO | Admitting: Family Medicine

## 2022-09-14 ENCOUNTER — Encounter: Payer: Self-pay | Admitting: Family Medicine

## 2022-09-14 VITALS — BP 130/73 | HR 71 | Temp 97.9°F | Resp 16 | Ht 62.5 in | Wt 216.2 lb

## 2022-09-14 DIAGNOSIS — E559 Vitamin D deficiency, unspecified: Secondary | ICD-10-CM | POA: Diagnosis not present

## 2022-09-14 DIAGNOSIS — E669 Obesity, unspecified: Secondary | ICD-10-CM

## 2022-09-14 DIAGNOSIS — Z6838 Body mass index (BMI) 38.0-38.9, adult: Secondary | ICD-10-CM | POA: Diagnosis not present

## 2022-09-14 DIAGNOSIS — K219 Gastro-esophageal reflux disease without esophagitis: Secondary | ICD-10-CM

## 2022-09-14 DIAGNOSIS — E785 Hyperlipidemia, unspecified: Secondary | ICD-10-CM | POA: Diagnosis not present

## 2022-09-14 DIAGNOSIS — R194 Change in bowel habit: Secondary | ICD-10-CM

## 2022-09-14 DIAGNOSIS — J45909 Unspecified asthma, uncomplicated: Secondary | ICD-10-CM | POA: Insufficient documentation

## 2022-09-14 DIAGNOSIS — L989 Disorder of the skin and subcutaneous tissue, unspecified: Secondary | ICD-10-CM

## 2022-09-14 LAB — CBC WITH DIFFERENTIAL/PLATELET
Basophils Absolute: 0 10*3/uL (ref 0.0–0.1)
Basophils Relative: 0.9 % (ref 0.0–3.0)
Eosinophils Absolute: 0.1 10*3/uL (ref 0.0–0.7)
Eosinophils Relative: 2.3 % (ref 0.0–5.0)
HCT: 38.4 % (ref 36.0–46.0)
Hemoglobin: 12.3 g/dL (ref 12.0–15.0)
Lymphocytes Relative: 42.4 % (ref 12.0–46.0)
Lymphs Abs: 2.3 10*3/uL (ref 0.7–4.0)
MCHC: 32 g/dL (ref 30.0–36.0)
MCV: 83.6 fl (ref 78.0–100.0)
Monocytes Absolute: 0.3 10*3/uL (ref 0.1–1.0)
Monocytes Relative: 5.4 % (ref 3.0–12.0)
Neutro Abs: 2.7 10*3/uL (ref 1.4–7.7)
Neutrophils Relative %: 49 % (ref 43.0–77.0)
Platelets: 268 10*3/uL (ref 150.0–400.0)
RBC: 4.59 Mil/uL (ref 3.87–5.11)
RDW: 13.2 % (ref 11.5–15.5)
WBC: 5.4 10*3/uL (ref 4.0–10.5)

## 2022-09-14 LAB — COMPREHENSIVE METABOLIC PANEL
ALT: 16 U/L (ref 0–35)
AST: 16 U/L (ref 0–37)
Albumin: 4.2 g/dL (ref 3.5–5.2)
Alkaline Phosphatase: 55 U/L (ref 39–117)
BUN: 10 mg/dL (ref 6–23)
CO2: 27 mEq/L (ref 19–32)
Calcium: 9.1 mg/dL (ref 8.4–10.5)
Chloride: 103 mEq/L (ref 96–112)
Creatinine, Ser: 0.83 mg/dL (ref 0.40–1.20)
GFR: 88.98 mL/min (ref >60.00)
Glucose, Bld: 86 mg/dL (ref 70–99)
Potassium: 4.1 mEq/L (ref 3.5–5.1)
Sodium: 137 mEq/L (ref 135–145)
Total Bilirubin: 0.5 mg/dL (ref 0.2–1.2)
Total Protein: 7.6 g/dL (ref 6.0–8.3)

## 2022-09-14 LAB — VITAMIN D 25 HYDROXY (VIT D DEFICIENCY, FRACTURES): VITD: 26.43 ng/mL — ABNORMAL LOW (ref 30.00–100.00)

## 2022-09-14 LAB — LIPID PANEL
Cholesterol: 178 mg/dL (ref 0–200)
HDL: 62.5 mg/dL (ref >39.00)
LDL Cholesterol: 109 mg/dL — ABNORMAL HIGH (ref 0–99)
NonHDL: 115.8
Total CHOL/HDL Ratio: 3
Triglycerides: 33 mg/dL (ref 0.0–149.0)
VLDL: 6.6 mg/dL (ref 0.0–40.0)

## 2022-09-14 LAB — HEMOGLOBIN A1C: Hgb A1c MFr Bld: 5.8 % (ref 4.6–6.5)

## 2022-09-14 LAB — TSH: TSH: 1.2 u[IU]/mL (ref 0.35–5.50)

## 2022-09-14 NOTE — Assessment & Plan Note (Signed)
Labs today

## 2022-09-14 NOTE — Patient Instructions (Addendum)
*Labs today and referral ordered *If the skin lesions change size/shape/color while we are waiting for you to get in with dermatology, please let us know.    Thank you for choosing [facility] at [facility] for your Primary Care needs. I am excited for the opportunity to partner with you to meet your health care goals. It was a pleasure meeting you today!  Information on diet, exercise, and health maintenance recommendations are listed below. This is information to help you be sure you are on track for optimal health and monitoring.   Please look over this and let us know if you have any questions or if you have completed any of the health maintenance outside of [place] so that we can be sure your records are up to date.  ___________________________________________________________  MyChart:  For all urgent or time sensitive needs we ask that you please call the office to avoid delays. Our number is [phone]. MyChart is not constantly monitored and due to the large volume of messages a day, replies may take up to 72 business hours.  MyChart Policy: MyChart allows for you to see your visit notes, after visit summary, provider recommendations, lab and tests results, make an appointment, request refills, and contact your provider or the office for non-urgent questions or concerns. Providers are seeing patients during normal business hours and do not have built in time to review MyChart messages.  We ask that you allow a minimum of 3 business days for responses to [employer]. For this reason, please do not send urgent requests through [place]. Please call the office at [phone]. New and ongoing conditions may require a visit. We have virtual and in-person visits available for your convenience.  Complex MyChart concerns may require a visit. Your provider may request you schedule a virtual or in-person visit to ensure we are providing the best care  possible. MyChart messages sent after 11:00 AM on Friday will not be received by the provider until Monday morning.    Lab and Test Results: You will receive your lab and test results on MyChart as soon as they are completed and results have been sent by the lab or testing facility. Due to this service, you will receive your results BEFORE your provider.  I review lab and test results each morning prior to seeing patients. Some results require collaboration with other providers to ensure you are receiving the most appropriate care. For this reason, we ask that you please allow a minimum of 3-5 business days from the time that ALL results have been received for your provider to receive and review lab and test results and contact you about these.  Most lab and test result comments from the provider will be sent through [place]. Your provider may recommend changes to the plan of care, follow-up visits, repeat testing, ask questions, or request an office visit to discuss these results. You may reply directly to this message or call the office to provide information for the provider or set up an appointment. In some instances, you will be called with test results and recommendations. Please let us know if this is preferred and we will make note of this in your chart to provide this for you.    If you have not heard a response to your lab or test results in 5 business days from all results returning to [place], please call the office to let us know. We ask that you please avoid calling prior to  this time unless there is an emergent concern. Due to high call volumes, this can delay the resulting process.  After Hours: For all non-emergency after hours needs, please call the office at [phone] and select the option to reach the on-call  service. On-call services are shared between multiple [facility] offices and therefore it will not be possible to speak directly with your provider. On-call providers may  provide medical advice and recommendations, but are unable to provide refills for maintenance medications.  For all emergency or urgent medical needs after normal business hours, we recommend that you seek care at the closest Urgent Care or Emergency Department to ensure appropriate treatment in a timely manner.  [facility] has a 24 hour emergency room located on the ground floor for your convenience.   Urgent Concerns During the Business Day Providers are seeing patients from 8AM to 5PM with a busy schedule and are most often not able to respond to non-urgent calls until the end of the day or the next business day. If you should have URGENT concerns during the day, please call and speak to the nurse or schedule a same day appointment so that we can address your concern without delay.   Thank you, again, for choosing me as your health care partner. I appreciate your trust and look forward to learning more about you!   Lollie Marrow Reola Calkins, DNP, FNP-C  ___________________________________________________________  Health Maintenance Recommendations Screening Testing Mammogram Every 1-2 years based on history and risk factors Starting at age 86 Pap Smear Ages 40-39 every 3 years Ages 40-65 every 5 years with HPV testing More frequent testing may be required based on results and history Colon Cancer Screening Every 1-10 years based on test performed, risk factors, and history Starting at age 40 Bone Density Screening Every 2-10 years based on history Starting at age 40 for women Recommendations for men differ based on medication usage, history, and risk factors AAA Screening One time ultrasound Men 40-61 years old who have ever smoked Lung Cancer Screening Low Dose Lung CT every 12 months Age 40-80 years with a 20 pack-year smoking history who still smoke or who have quit within the last 15 years  Screening Labs Routine  Labs: Complete Blood Count (CBC), Complete Metabolic Panel  (CMP), Cholesterol (Lipid Panel) Every 6-12 months based on history and medications May be recommended more frequently based on current conditions or previous results Hemoglobin A1c Lab Every 3-12 months based on history and previous results Starting at age 49 or earlier with diagnosis of diabetes, high cholesterol, BMI >26, and/or risk factors Frequent monitoring for patients with diabetes to ensure blood sugar control Thyroid Panel  Every 6 months based on history, symptoms, and risk factors May be repeated more often if on medication HIV One time testing for all patients 74 and older May be repeated more frequently for patients with increased risk factors or exposure Hepatitis C One time testing for all patients 15 and older May be repeated more frequently for patients with increased risk factors or exposure Gonorrhea, Chlamydia Every 12 months for all sexually active persons 13-24 years Additional monitoring may be recommended for those who are considered high risk or who have symptoms PSA Men 58-82 years old with risk factors Additional screening may be recommended from age 68-69 based on risk factors, symptoms, and history  Vaccine Recommendations Tetanus Booster All adults every 10 years Flu Vaccine All patients 6 months and older every year COVID Vaccine All patients 12  years and older Initial dosing with booster May recommend additional booster based on age and health history HPV Vaccine 2 doses all patients age 88-26 Dosing may be considered for patients over 26 Shingles Vaccine (Shingrix) 2 doses all adults 69 years and older Pneumonia (Pneumovax 23) All adults 103 years and older May recommend earlier dosing based on health history Pneumonia (Prevnar 80) All adults 87 years and older Dosed 1 year after Pneumovax 23 Pneumonia (Prevnar 77) All adults 29 years and older (adults 58-85 with certain conditions or risk factors) 1 dose  For those who have not received  Prevnar 13 vaccine previously   Additional Screening, Testing, and Vaccinations may be recommended on an individualized basis based on family history, health history, risk factors, and/or exposure.  __________________________________________________________  Diet Recommendations for All Patients  I recommend that all patients maintain a diet low in saturated fats, carbohydrates, and cholesterol. While this can be challenging at first, it is not impossible and small changes can make big differences.  Things to try: Decreasing the amount of soda, sweet tea, and/or juice to one or less per day and replace with water While water is always the first choice, if you do not like water you may consider adding a water additive without sugar to improve the taste other sugar free drinks Replace potatoes with a brightly colored vegetable  Use healthy oils, such as canola oil or olive oil, instead of butter or hard margarine Limit your bread intake to two pieces or less a day Replace regular pasta with low carb pasta options Bake, broil, or grill foods instead of frying Monitor portion sizes  Eat smaller, more frequent meals throughout the day instead of large meals  An important thing to remember is, if you love foods that are not great for your health, you don't have to give them up completely. Instead, allow these foods to be a reward when you have done well. Allowing yourself to still have special treats every once in a while is a nice way to tell yourself thank you for working hard to keep yourself healthy.   Also remember that every day is a new day. If you have a bad day and "fall off the wagon", you can still climb right back up and keep moving along on your journey!  We have resources available to help you!  Some websites that may be helpful include: www.http://carter.biz/  Www.[employer].com _____________________________________________________________  Activity Recommendations for All  Patients  I recommend that all adults get at least 20 minutes of moderate physical activity that elevates your heart rate at least 5 days out of the week.  Some examples include: Walking or jogging at a pace that allows you to carry on a conversation Cycling (stationary bike or outdoors) Water aerobics Yoga Weight lifting Dancing If physical limitations prevent you from putting stress on your joints, exercise in a pool or seated in a chair are excellent options.  Do determine your MAXIMUM heart rate for activity: 220 - YOUR AGE = MAX Heart Rate   Remember! Do not push yourself too hard.  Start slowly and build up your pace, speed, weight, time in exercise, etc.  Allow your body to rest between exercise and get good sleep. You will need more water than normal when you are exerting yourself. Do not wait until you are thirsty to drink. Drink with a purpose of getting in at least 8, 8 ounce glasses of water a day plus more depending on how much you  exercise and sweat.    If you begin to develop dizziness, chest pain, abdominal pain, jaw pain, shortness of breath, headache, vision changes, lightheadedness, or other concerning symptoms, stop the activity and allow your body to rest. If your symptoms are severe, seek emergency evaluation immediately. If your symptoms are concerning, but not severe, please let us know so that we can recommend further evaluation.

## 2022-09-14 NOTE — Assessment & Plan Note (Signed)
Currently on daily supplementation Labs today

## 2022-09-14 NOTE — Assessment & Plan Note (Signed)
Labs today Recommend heart healthy, low-carb diet with portion control and regular exercise

## 2022-09-14 NOTE — Progress Notes (Signed)
New Patient Office Visit  Subjective    Patient ID: Sylvia Townsend, female    DOB: 1980-11-28  Age: 42 y.o. MRN: [id number]  CC:  Chief Complaint  Patient presents with   Establish Care    GI issues and skin issues    HPI Sylvia Townsend presents to establish care. She was previously with [facility].    Migraines: - Rare migraines, but has had some occasional headaches. Tylenol extra strength usually works for her. Cannot remember the last time she had a true migraine.   Asthma: - stable - PRN albuterol, Singulair  GI concerns: - GERD: PRN meds for reflux usually helps; stable - She has had some GI symptoms recently: occasional diarrhea, bloating and gas pains after meals. She denies abdominal pain, blood in stool, fevers, chills, nausea, vomiting. She thinks dairy may be a trigger. She would like to see GI. - Father-side of the family with cancer history, but unsure types    Skin concerns: - She was treated for eczema and seborrheic dermatitis in the past by dermatology and it has resolved - She now has some hyperpigmentation, itching, raised rash (looks like razor bumps) - but it has been there for about a year without improving or worsening. She would like  to see dermatology   Vit D deficiency: - Daily supplement (500 units)  HLD: - lifestyle measures only      Outpatient Encounter Medications as of 09/14/2022  Medication Sig   acetaminophen (TYLENOL) 500 MG tablet Take 1,000 mg by mouth every 6 (six) hours as needed for moderate pain or headache.   albuterol (VENTOLIN HFA) 108 (90 Base) MCG/ACT inhaler Inhale 1-2 puffs into the lungs every 4 (four) hours as needed for wheezing or shortness of breath. Rescue inhaler   hydrocortisone 2.5 % ointment Apply topically 2 (two) times daily. Prn neck, face   ibuprofen (ADVIL,MOTRIN) 600 MG tablet Take 1 tablet (600 mg total) by mouth every 6 (six) hours.   Iron-FA-B Cmp-C-Biot-Probiotic (FUSION PLUS) CAPS  Take 1 capsule by mouth daily.   ketoconazole (NIZORAL) 2 % shampoo Apply 1 application topically 2 (two) times a week. Let sit x 5 minutes then rinse   levocetirizine (XYZAL) 5 MG tablet Take 1 tablet (5 mg total) by mouth every evening.   montelukast (SINGULAIR) 10 MG tablet Take 1 tablet (10 mg total) by mouth at bedtime.   Multiple Vitamin (MULTIVITAMIN) tablet Take 1 tablet by mouth daily.   triamcinolone ointment (KENALOG) 0.1 % Apply 1 application topically 2 (two) times daily. Neck prn   [DISCONTINUED] Semaglutide-Weight Management (WEGOVY) 0.25 MG/0.5ML SOAJ Inject 0.25 mg into the skin once a week.   [DISCONTINUED] Semaglutide-Weight Management (WEGOVY) 0.5 MG/0.5ML SOAJ Inject 0.5 mg into the skin once a week.   [DISCONTINUED] Semaglutide-Weight Management (WEGOVY) 1 MG/0.5ML SOAJ Inject 1 mg into the skin once a week.   [DISCONTINUED] Semaglutide-Weight Management (WEGOVY) 1.7 MG/0.75ML SOAJ Inject 1.7 mg into the skin once a week.   [DISCONTINUED] Semaglutide-Weight Management (WEGOVY) 2.4 MG/0.75ML SOAJ Inject 2.4 mg into the skin once a week.   No facility-administered encounter medications on file as of 09/14/2022.    Past Medical History:  Diagnosis Date   Abnormal Pap smear 2004,2005   Hx of.   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 2 as a  Toddler    Past Surgical History:  Procedure Laterality Date   WISDOM TOOTH EXTRACTION  2007   all 4 removed    Family History  Problem Relation Age of Onset   Stroke Maternal Grandfather    Hypertension Maternal Grandfather    Cancer Paternal Uncle        Throat   Cancer Paternal Grandmother        Unsure which type   Migraines Father    Other Father        cardiac  amyloidosis   Asthma Sister        x 2   Asthma Cousin        Maternal   Breast cancer Cousin    Asthma Other        Nephew   Kidney failure Maternal Uncle     Lupus Sister    Diverticulitis Mother    Stroke Maternal Grandmother    Cancer Paternal Uncle        stomach cancer    Other Brother        covid 87     Social History   Socioeconomic History   Marital status: Married    Spouse name: Not on file   Number of children: Not on file   Years of education: Not on file   Highest education level: Not on file  Occupational History   Occupation: CSR    Employer: [employer]    Comment: [employer]  Tobacco Use   Smoking status: Never   Smokeless tobacco: Never  Substance and Sexual Activity   Alcohol use: Yes    Alcohol/week: 2.0 standard drinks of alcohol    Types: 2 Standard drinks or equivalent per week    Comment: socially    Drug use: No   Sexual activity: Yes    Partners: Male    Birth control/protection: Pill  Other Topics Concern   Not on file  Social History Narrative   Married with children 2 children    She is middle child   Social Determinants of Radio broadcast assistant Strain: Not on file  Food Insecurity: Not on file  Transportation Needs: Not on file  Physical Activity: Not on file  Stress: Not on file  Social Connections: Not on file  Intimate Partner Violence: Not on file    ROS All review of systems negative except what is listed in the HPI      Objective    BP 130/73   Pulse 71   Temp 97.9 F (36.6 C)   Resp 16   Ht 5' 2.5" (1.588 m)   Wt 216 lb 3.2 oz (98.1 kg)   SpO2 100%   BMI 38.91 kg/m   Physical Exam Vitals reviewed.  Constitutional:      Appearance: Normal appearance. She is obese.  Cardiovascular:     Rate and Rhythm: Normal rate and regular rhythm.     Pulses: Normal pulses.     Heart sounds: Normal heart sounds.  Pulmonary:     Effort: Pulmonary effort is normal.     Breath sounds: Normal breath sounds.  Skin:    General: Skin is warm and dry.     Comments: See picture of bilateral lower leg lesions  Neurological:     Mental Status: She is alert and  oriented to person, place, and time.  Psychiatric:        Mood and Affect: Mood normal.  Behavior: Behavior normal.        Thought Content: Thought content normal.        Judgment: Judgment normal.                    Assessment & Plan:   Problem List Items Addressed This Visit       Digestive   Acid reflux - Primary    Stable with PRN antacids and lifestyle measures      Relevant Orders   Ambulatory referral to Gastroenterology     Other   Vitamin D deficiency    Currently on daily supplementation Labs today       Relevant Orders   Vitamin D (25 hydroxy)   HLD (hyperlipidemia)    Labs today      Relevant Orders   Comprehensive metabolic panel   Lipid panel   Class 2 obesity without serious comorbidity with body mass index (BMI) of 38.0 to 38.9 in adult    Labs today Recommend heart healthy, low-carb diet with portion control and regular exercise      Relevant Orders   CBC with Differential/Platelet   Comprehensive metabolic panel   Lipid panel   TSH   Hemoglobin A1c   Other Visit Diagnoses     Skin lesion     Referral to dermatology to further evaluate Recommend she notify us if the lesions change at all while waiting to get in with dermatology    Relevant Orders   Ambulatory referral to Dermatology   Bowel habit changes     Discussed IBS possibility and recommend she start a food diary and try low FODMAP diet.  She is requesting to go ahead and see GI   Relevant Orders   Ambulatory referral to Gastroenterology       Return for pending labs; CPE after 01/21/23.   Clayborne Dana, NP

## 2022-09-14 NOTE — Assessment & Plan Note (Signed)
Stable with PRN antacids and lifestyle measures

## 2022-09-30 ENCOUNTER — Encounter: Payer: No Typology Code available for payment source | Admitting: Nurse Practitioner

## 2022-12-15 ENCOUNTER — Encounter: Payer: Self-pay | Admitting: Nurse Practitioner

## 2023-02-15 ENCOUNTER — Encounter: Payer: Self-pay | Admitting: Nurse Practitioner

## 2023-02-15 ENCOUNTER — Ambulatory Visit (INDEPENDENT_AMBULATORY_CARE_PROVIDER_SITE_OTHER): Payer: Commercial Managed Care - PPO | Admitting: Nurse Practitioner

## 2023-02-15 VITALS — BP 118/80 | HR 78 | Ht 62.0 in | Wt 209.0 lb

## 2023-02-15 DIAGNOSIS — R143 Flatulence: Secondary | ICD-10-CM

## 2023-02-15 DIAGNOSIS — R109 Unspecified abdominal pain: Secondary | ICD-10-CM

## 2023-02-15 DIAGNOSIS — R197 Diarrhea, unspecified: Secondary | ICD-10-CM

## 2023-02-15 MED ORDER — DICYCLOMINE HCL 10 MG PO CAPS: 10.0000 mg | ORAL_CAPSULE | Freq: Two times a day (BID) | ORAL | 3 refills | Status: AC | PRN

## 2023-02-15 NOTE — Progress Notes (Signed)
Primary GI:  Assessment / Plan   42 y.o. yo female with a past medical history consisting of, but not necessarily limited to migraines, asthma, GERD, obesity, vitamin D deficiency, anxiety/depression  Chronic postprandial belching / flatulence / crampy diarrhea depending on diet.  Consumption of dairy, fatty foods and cauliflower exacerbate symptoms. She feels fine in between episodes.  -Low FODMAP diet  -Trial of dicyclomine 10 mg BID as needed for abdominal cramps -Gas-x as needed.  -Follow up with me in ~ 2 months.    History of Present Illness   Chief Complaint:  belching, lower gas, crampy diarrhea after eating certain foods.   Referred by PCP for GERD and bowel habit changes  Sylvia Townsend belches a lot after eating, especially large meals.  No heartburn or regurgitation. Additionally she complains of excessive flatulence / crampy diarrhea depending on what she eats. Milks, cheese and other dairy products make exacerbate her symptoms. Foods with "sauce"  are also problematic . Eating a lot of cauliflower rice to lose weight and that worsens the gas.  She is trying to figure out more culprit foods. She went vegetarian for a while and didn't have any of these GI problems. When she resumed eating meat (mainly red meat)  the GI symptoms returned.   No blood in stool. No weight loss (she is trying to lose weight ). No known FMH of colon cancer   Previous Endoscopies / Labs /  Imaging   No recent abdominal imaging     Latest Ref Rng & Units 09/14/2022   10:00 AM 01/20/2022    4:41 PM 01/15/2021    9:25 AM  CBC  WBC 4.0 - 10.5 K/uL 5.4  6.6  5.7   Hemoglobin 12.0 - 15.0 g/dL 16.1  09.6  04.5   Hematocrit 36.0 - 46.0 % 38.4  38.9  38.1   Platelets 150.0 - 400.0 K/uL 268.0  230.0  235.0        Latest Ref Rng & Units 09/14/2022   10:00 AM 01/20/2022    4:41 PM 01/15/2021    9:25 AM  CMP  Glucose 70 - 99 mg/dL 86  77  90   BUN 6 - 23 mg/dL 10  9  9    Creatinine 0.40 - 1.20 mg/dL  4.09  8.11  9.14   Sodium 135 - 145 mEq/L 137  138  137   Potassium 3.5 - 5.1 mEq/L 4.1  3.3  4.0   Chloride 96 - 112 mEq/L 103  102  104   CO2 19 - 32 mEq/L 27  25  27    Calcium 8.4 - 10.5 mg/dL 9.1  9.4  9.1   Total Protein 6.0 - 8.3 g/dL 7.6  8.0  7.3   Total Bilirubin 0.2 - 1.2 mg/dL 0.5  0.7  0.7   Alkaline Phos 39 - 117 U/L 55  57  58   AST 0 - 37 U/L 16  18  16    ALT 0 - 35 U/L 16  18  16      Past Medical History:  Diagnosis Date   Abnormal Pap smear 2004,2005   Hx of.   Acid reflux    Asthma    Induced w/ weather changes;inhaler prn   COVID-19    04/2020 tired/h/a   Migraines 2012   Seizure (HCC)    x 2 as a Toddler   Past Surgical History:  Procedure Laterality Date   WISDOM TOOTH EXTRACTION  2007   all  4 removed   Family History  Problem Relation Age of Onset   Stroke Maternal Grandfather    Hypertension Maternal Grandfather    Cancer Paternal Uncle        Throat   Cancer Paternal Grandmother        Unsure which type   Migraines Father    Other Father        cardiac  amyloidosis   Asthma Sister        x 2   Asthma Cousin        Maternal   Breast cancer Cousin    Asthma Other        Nephew   Kidney failure Maternal Uncle    Lupus Sister    Diverticulitis Mother    Stroke Maternal Grandmother    Cancer Paternal Uncle        stomach cancer    Other Brother        covid 84    Social History   Tobacco Use   Smoking status: Never   Smokeless tobacco: Never  Substance Use Topics   Alcohol use: Yes    Alcohol/week: 2.0 standard drinks of alcohol    Types: 2 Standard drinks or equivalent per week    Comment: socially    Drug use: No   Current Outpatient Medications  Medication Sig Dispense Refill   acetaminophen (TYLENOL) 500 MG tablet Take 1,000 mg by mouth every 6 (six) hours as needed for moderate pain or headache.     albuterol (VENTOLIN HFA) 108 (90 Base) MCG/ACT inhaler Inhale 1-2 puffs into the lungs every 4 (four) hours as needed for  wheezing or shortness of breath. Rescue inhaler 18 g 12   hydrocortisone 2.5 % ointment Apply topically 2 (two) times daily. Prn neck, face 60 g 11   ibuprofen (ADVIL,MOTRIN) 600 MG tablet Take 1 tablet (600 mg total) by mouth every 6 (six) hours. 30 tablet 0   Iron-FA-B Cmp-C-Biot-Probiotic (FUSION PLUS) CAPS Take 1 capsule by mouth daily.  4   ketoconazole (NIZORAL) 2 % shampoo Apply 1 application topically 2 (two) times a week. Let sit x 5 minutes then rinse 120 mL 11   levocetirizine (XYZAL) 5 MG tablet Take 1 tablet (5 mg total) by mouth every evening. 90 tablet 3   montelukast (SINGULAIR) 10 MG tablet Take 1 tablet (10 mg total) by mouth at bedtime. 90 tablet 3   Multiple Vitamin (MULTIVITAMIN) tablet Take 1 tablet by mouth daily.     triamcinolone ointment (KENALOG) 0.1 % Apply 1 application topically 2 (two) times daily. Neck prn 80 g 0   No current facility-administered medications for this visit.   No Known Allergies   Review of Systems: Positive for allergy, sinus trouble, back pain, skin rash.  All other systems reviewed and negative except where noted in HPI.   Wt Readings from Last 3 Encounters:  09/14/22 216 lb 3.2 oz (98.1 kg)  01/20/22 209 lb (94.8 kg)  07/22/21 203 lb 12.8 oz (92.4 kg)    Physical Exam:  BP 118/80   Pulse 78   Ht 5\' 2"  (1.575 m)   Wt 209 lb (94.8 kg)   BMI 38.23 kg/m  Constitutional:  Pleasant, obese but generally well appearing female in no acute distress. Psychiatric:  Normal mood and affect. Behavior is normal. EENT: Pupils normal.  Conjunctivae are normal. No scleral icterus. Neck supple.  Cardiovascular: Normal rate, regular rhythm.  Pulmonary/chest: Effort normal and breath sounds normal. No wheezing,  rales or rhonchi. Abdominal: Soft, nondistended, nontender. Bowel sounds active throughout. There are no masses palpable. No hepatomegaly. Neurological: Alert and oriented to person place and time. Skin: Skin is warm and dry. No rashes  noted.  Willette Cluster, NP  02/15/2023, 8:17 AM  Cc:  Referring Provider Clayborne Dana, NP

## 2023-02-15 NOTE — Progress Notes (Signed)
Agree with assessment / plan as outlined.  

## 2023-02-15 NOTE — Patient Instructions (Addendum)
_______________________________________________________  If your blood pressure at your visit was 140/90 or greater, please contact your primary care physician to follow up on this.  If you are age 40 or younger, your body mass index should be between 19-25. Your Body mass index is 38.23 kg/m. If this is out of the aformentioned range listed, please consider follow up with your Primary Care Provider.  ________________________________________________________  The [facility] providers would like to encourage you to use [facility] to communicate with providers for non-urgent requests or questions.  Due to long hold times on the telephone, sending your provider a message by [facility] may be a faster and more efficient way to get a response.  Please allow 48 business hours for a response.  Please remember that this is for non-urgent requests.  _______________________________________________________  We have sent the following medications to your pharmacy for you to pick up at your convenience:  START: dicyclomine 10mg  one tablet two times daily as needed for abdominal cramping.  Please purchase the following medications over the counter and take as directed:  START: Gas-X as needed.  You are scheduled to follow up on 04-28-23 at 11:00am.  Thank you for entrusting me with your care and choosing [facility].  Gunnar Fusi, NP

## 2023-04-23 ENCOUNTER — Ambulatory Visit: Payer: No Typology Code available for payment source | Admitting: Internal Medicine

## 2023-04-28 ENCOUNTER — Telehealth: Payer: Self-pay

## 2023-04-28 ENCOUNTER — Ambulatory Visit (INDEPENDENT_AMBULATORY_CARE_PROVIDER_SITE_OTHER): Payer: Commercial Managed Care - PPO | Admitting: Nurse Practitioner

## 2023-04-28 ENCOUNTER — Encounter: Payer: Self-pay | Admitting: Nurse Practitioner

## 2023-04-28 VITALS — BP 120/88 | HR 79 | Ht 62.0 in | Wt 211.0 lb

## 2023-04-28 DIAGNOSIS — R140 Abdominal distension (gaseous): Secondary | ICD-10-CM | POA: Diagnosis not present

## 2023-04-28 DIAGNOSIS — R143 Flatulence: Secondary | ICD-10-CM

## 2023-04-28 NOTE — Patient Instructions (Addendum)
Continue to look for foods that cause your GI distress.  Follow up as needed.  Due to recent changes in healthcare laws, you may see the results of your imaging and laboratory studies on MyChart before your provider has had a chance to review them.  We understand that in some cases there may be results that are confusing or concerning to you. Not all laboratory results come back in the same time frame and the provider may be waiting for multiple results in order to interpret others.  Please give [place] 48 hours in order for your provider to thoroughly review all the results before contacting the office for clarification of your results.   Thank you for trusting me with your gastrointestinal care!   Alcide Evener, CRNP

## 2023-04-28 NOTE — Progress Notes (Signed)
Agree with assessment and plan as outlined. Sylvia Townsend she may benefit from [facility] supplement which can be helpful with these symptoms. Jan has handouts on this and can send to the patient to see if this may help her.   Jan, can you send information to the patient about how to get the [facility] supplement if you still have the handout? I have it on my email if needed. Thanks

## 2023-04-28 NOTE — Progress Notes (Signed)
Sorry, attaching Jan to this

## 2023-04-28 NOTE — Telephone Encounter (Signed)
FODZYME  handout mailed to patient per dr Adela Lank

## 2023-04-28 NOTE — Progress Notes (Signed)
ASSESSMENT & PLAN   42 y.o.  female known to Dr. Adela Lank.   She established care here in June ( saw me) and is here for follow up  Chronic postprandial belching / flatulence / crampy diarrhea Symptoms significantly improved with dietary changes alone.  -Continue to eliminate foods from low FODMAP diet one at a time looking for culprits. She has already identified several  culprit foods --I was unable to make any recommendations about the Papaya Enzymes Plus she is taking to ease any symptoms related to spicy foods --Follow up as needed or if symptoms recur despite omission of culprit foods.   GERD, occasional symptoms relieved with prn antacids  History of migraines, asthma, obesity, vitamin D deficiency, menorrhagia,  anxiety/depression .   See PMH below for additional history  HPI   Chief complaint :Doing much better after eliminating certain foods.   At 02/15/23 visit  for chronic postprandial belching / flatulence / crampy diarrhea the following recommendations were made: -Low FODMAP diet  -Trial of dicyclomine 10 mg BID as needed for abdominal cramps -Gas-x as needed.   Interval History Sylvia Townsend has been eliminating foods one by one from low FODMAP diet given at last visit. She was already aware of her intolerance to dairy, fatty foods and cauliflower but is discovering more food intolerances such as broccoli, beans and some spicy foods. With dietary changes she has had much less postprandial bloating / flatulence and diarrhea. Has mostly formed stools now.  A friend told her about Papaya Enzymes Plus which she has been taking when consuming spicy foods. She never did try Gas-x recommended at last visit, she has used Pepto instead. Sounds like insurance didn't cover Bentyl. She has been during 8 hour intermittent fasting but not yet been able to lose weight. Not exercising   Previous GI Endoscopies / Labs / Imaging       Latest Ref Rng & Units 09/14/2022   10:00 AM  01/20/2022    4:41 PM 01/15/2021    9:25 AM  Hepatic Function  Total Protein 6.0 - 8.3 g/dL 7.6  8.0  7.3   Albumin 3.5 - 5.2 g/dL 4.2  4.4  4.1   AST 0 - 37 U/L 16  18  16    ALT 0 - 35 U/L 16  18  16    Alk Phosphatase 39 - 117 U/L 55  57  58   Total Bilirubin 0.2 - 1.2 mg/dL 0.5  0.7  0.7        Latest Ref Rng & Units 09/14/2022   10:00 AM 01/20/2022    4:41 PM 01/15/2021    9:25 AM  CBC  WBC 4.0 - 10.5 K/uL 5.4  6.6  5.7   Hemoglobin 12.0 - 15.0 g/dL 40.9  81.1  91.4   Hematocrit 36.0 - 46.0 % 38.4  38.9  38.1   Platelets 150.0 - 400.0 K/uL 268.0  230.0  235.0      Past Medical History:  Diagnosis Date   Abnormal Pap smear 2004,2005   Hx of.   Acid reflux    Asthma    Induced w/ weather changes;inhaler prn   COVID-19    04/2020 tired/h/a   Migraines 2012   Seizure (HCC)    x 2 as a Toddler    Past Surgical History:  Procedure Laterality Date   WISDOM TOOTH EXTRACTION  2007   all 4 removed    Family History  Problem Relation Age of Onset  Stroke Maternal Grandfather    Hypertension Maternal Grandfather    Cancer Paternal Uncle        Throat   Cancer Paternal Grandmother        Unsure which type   Migraines Father    Other Father        cardiac  amyloidosis   Asthma Sister        x 2   Asthma Cousin        Maternal   Breast cancer Cousin    Asthma Other        Nephew   Kidney failure Maternal Uncle    Lupus Sister    Diverticulitis Mother    Stroke Maternal Grandmother    Cancer Paternal Uncle        stomach cancer    Other Brother        covid 19     Current Medications, Allergies, Family History and Social History were reviewed in [employer] electronic medical record.     Current Outpatient Medications  Medication Sig Dispense Refill   acetaminophen (TYLENOL) 500 MG tablet Take 1,000 mg by mouth every 6 (six) hours as needed for moderate pain or headache.     albuterol (VENTOLIN HFA) 108 (90 Base) MCG/ACT inhaler Inhale 1-2 puffs into  the lungs every 4 (four) hours as needed for wheezing or shortness of breath. Rescue inhaler 18 g 12   dicyclomine (BENTYL) 10 MG capsule Take 1 capsule (10 mg total) by mouth 2 (two) times daily as needed for spasms. 60 capsule 3   hydrocortisone 2.5 % ointment Apply topically 2 (two) times daily. Prn neck, face 60 g 11   ibuprofen (ADVIL,MOTRIN) 600 MG tablet Take 1 tablet (600 mg total) by mouth every 6 (six) hours. 30 tablet 0   Iron-FA-B Cmp-C-Biot-Probiotic (FUSION PLUS) CAPS Take 1 capsule by mouth daily.  4   ketoconazole (NIZORAL) 2 % shampoo Apply 1 application topically 2 (two) times a week. Let sit x 5 minutes then rinse 120 mL 11   levocetirizine (XYZAL) 5 MG tablet Take 1 tablet (5 mg total) by mouth every evening. 90 tablet 3   montelukast (SINGULAIR) 10 MG tablet Take 1 tablet (10 mg total) by mouth at bedtime. 90 tablet 3   Multiple Vitamin (MULTIVITAMIN) tablet Take 1 tablet by mouth daily.     NON FORMULARY Pt taking women one a day vitamin     triamcinolone ointment (KENALOG) 0.1 % Apply 1 application topically 2 (two) times daily. Neck prn 80 g 0   No current facility-administered medications for this visit.    Review of Systems: No chest pain. No shortness of breath. No urinary complaints.    Physical Exam  Wt Readings from Last 3 Encounters:  04/28/23 211 lb (95.7 kg)  02/15/23 209 lb (94.8 kg)  09/14/22 216 lb 3.2 oz (98.1 kg)    BP 120/88   Pulse 79   Ht 5\' 2"  (1.575 m)   Wt 211 lb (95.7 kg)   BMI 38.59 kg/m  Constitutional:  Pleasant, generally well appearing female in no acute distress. Psychiatric: Normal mood and affect. Behavior is normal. EENT: Pupils normal.  Conjunctivae are normal. No scleral icterus. Neck supple.  Cardiovascular: Normal rate, regular rhythm.  Pulmonary/chest: Effort normal and breath sounds normal. No wheezing, rales or rhonchi. Abdominal: Soft, nondistended, nontender. Bowel sounds active throughout. There are no masses  palpable. No hepatomegaly. Neurological: Alert and oriented to person place and time.   ad  Willette Cluster, NP  04/28/2023, 11:18 AM

## 2023-06-17 ENCOUNTER — Other Ambulatory Visit: Payer: Self-pay | Admitting: Family Medicine

## 2023-06-17 DIAGNOSIS — Z1231 Encounter for screening mammogram for malignant neoplasm of breast: Secondary | ICD-10-CM

## 2023-10-01 ENCOUNTER — Ambulatory Visit: Payer: Commercial Managed Care - PPO

## 2023-10-05 ENCOUNTER — Ambulatory Visit
Admission: RE | Admit: 2023-10-05 | Discharge: 2023-10-05 | Disposition: A | Discharge status: Home / Self Care | Payer: No Typology Code available for payment source | Source: Ambulatory Visit | Attending: Family Medicine

## 2023-10-05 DIAGNOSIS — Z1231 Encounter for screening mammogram for malignant neoplasm of breast: Secondary | ICD-10-CM

## 2024-06-21 ENCOUNTER — Other Ambulatory Visit: Payer: Self-pay | Admitting: Physician Assistant

## 2024-06-21 DIAGNOSIS — M5412 Radiculopathy, cervical region: Secondary | ICD-10-CM

## 2024-06-27 ENCOUNTER — Encounter: Payer: Self-pay | Admitting: Physician Assistant

## 2024-06-29 ENCOUNTER — Ambulatory Visit
Admission: RE | Admit: 2024-06-29 | Discharge: 2024-06-29 | Disposition: A | Discharge status: Home / Self Care | Source: Ambulatory Visit | Attending: Physician Assistant | Admitting: Physician Assistant

## 2024-06-29 DIAGNOSIS — M5412 Radiculopathy, cervical region: Secondary | ICD-10-CM

## 2024-08-22 ENCOUNTER — Other Ambulatory Visit: Payer: Self-pay | Admitting: Physician Assistant

## 2024-08-22 DIAGNOSIS — Z1231 Encounter for screening mammogram for malignant neoplasm of breast: Secondary | ICD-10-CM

## 2024-11-06 ENCOUNTER — Ambulatory Visit
